# Patient Record
Sex: Female | Born: 1990 | Hispanic: No | Marital: Married | Smoking: Never smoker
Health system: Southern US, Community
[De-identification: ages and names within clinical notes are randomized; demographics above are authoritative.]

## PROBLEM LIST (undated history)

## (undated) ENCOUNTER — Inpatient Hospital Stay (HOSPITAL_COMMUNITY): Payer: Self-pay

## (undated) DIAGNOSIS — Z789 Other specified health status: Secondary | ICD-10-CM

## (undated) HISTORY — DX: Other specified health status: Z78.9

## (undated) HISTORY — PX: NOSE SURGERY: SHX723

---

## 2011-11-01 DIAGNOSIS — Z3480 Encounter for supervision of other normal pregnancy, unspecified trimester: Secondary | ICD-10-CM | POA: Insufficient documentation

## 2017-02-22 DIAGNOSIS — D649 Anemia, unspecified: Secondary | ICD-10-CM

## 2017-02-22 HISTORY — DX: Anemia, unspecified: D64.9

## 2017-02-22 NOTE — L&D Delivery Note (Signed)
Patient is a 27 y.o. now G3P3 s/p NSVD at 475w3d, who was admitted for SROM.  She progressed with augmentation to complete and pushed 4 minutes to deliver.  Cord clamping delayed by several minutes then clamped by CNM and cut by FOB.  Placenta intact and spontaneous, bleeding minimal.  1st degree laceration identified, not repaired- hemostatic .  Mom and baby stable prior to transfer to postpartum. She plans on breastfeeding. She is unsure of method for birth control.  Delivery Note At 10:04 PM a viable and healthy female was delivered via Vaginal, Spontaneous (Presentation: LOA ).  APGAR: 9, 9; weight pending.    Placenta intact and spontaneous, bleeding minimal. 3VCord:  with no complications   Anesthesia: None  Episiotomy: None Lacerations: 1st degree;Perineal Suture Repair: none- hemostatic  Est. Blood Loss (mL): 307  Mom to postpartum.  Baby to Couplet care / Skin to Skin.  Sharyon CableVeronica C Achsah Mcquade CNM  01/16/2018, 11:18 PM

## 2017-06-16 ENCOUNTER — Encounter (HOSPITAL_BASED_OUTPATIENT_CLINIC_OR_DEPARTMENT_OTHER): Payer: Self-pay | Admitting: *Deleted

## 2017-06-16 ENCOUNTER — Inpatient Hospital Stay (HOSPITAL_BASED_OUTPATIENT_CLINIC_OR_DEPARTMENT_OTHER)
Admission: EM | Admit: 2017-06-16 | Discharge: 2017-06-16 | Disposition: A | Discharge status: Other Acute Inpatient Hospital | Payer: Medicaid Other | Attending: Obstetrics and Gynecology | Admitting: Obstetrics and Gynecology

## 2017-06-16 ENCOUNTER — Inpatient Hospital Stay (HOSPITAL_COMMUNITY): Payer: Medicaid Other

## 2017-06-16 ENCOUNTER — Other Ambulatory Visit: Payer: Self-pay

## 2017-06-16 DIAGNOSIS — R102 Pelvic and perineal pain unspecified side: Secondary | ICD-10-CM | POA: Diagnosis present

## 2017-06-16 DIAGNOSIS — R109 Unspecified abdominal pain: Secondary | ICD-10-CM

## 2017-06-16 DIAGNOSIS — O209 Hemorrhage in early pregnancy, unspecified: Secondary | ICD-10-CM | POA: Diagnosis not present

## 2017-06-16 DIAGNOSIS — O26891 Other specified pregnancy related conditions, first trimester: Secondary | ICD-10-CM | POA: Diagnosis not present

## 2017-06-16 DIAGNOSIS — O26899 Other specified pregnancy related conditions, unspecified trimester: Secondary | ICD-10-CM | POA: Diagnosis present

## 2017-06-16 DIAGNOSIS — O26859 Spotting complicating pregnancy, unspecified trimester: Secondary | ICD-10-CM | POA: Diagnosis present

## 2017-06-16 DIAGNOSIS — Z3A01 Less than 8 weeks gestation of pregnancy: Secondary | ICD-10-CM | POA: Insufficient documentation

## 2017-06-16 LAB — CBC
HEMATOCRIT: 32.3 % — AB (ref 36.0–46.0)
Hemoglobin: 11 g/dL — ABNORMAL LOW (ref 12.0–15.0)
MCH: 28.6 pg (ref 26.0–34.0)
MCHC: 34.1 g/dL (ref 30.0–36.0)
MCV: 84.1 fL (ref 78.0–100.0)
PLATELETS: 234 10*3/uL (ref 150–400)
RBC: 3.84 MIL/uL — ABNORMAL LOW (ref 3.87–5.11)
RDW: 14.8 % (ref 11.5–15.5)
WBC: 7.8 10*3/uL (ref 4.0–10.5)

## 2017-06-16 LAB — URINALYSIS, ROUTINE W REFLEX MICROSCOPIC
Bilirubin Urine: NEGATIVE
Glucose, UA: NEGATIVE mg/dL
Hgb urine dipstick: NEGATIVE
KETONES UR: 15 mg/dL — AB
LEUKOCYTES UA: NEGATIVE
NITRITE: NEGATIVE
PH: 6 (ref 5.0–8.0)
Protein, ur: NEGATIVE mg/dL
Specific Gravity, Urine: 1.025 (ref 1.005–1.030)

## 2017-06-16 LAB — COMPREHENSIVE METABOLIC PANEL
ALBUMIN: 3.9 g/dL (ref 3.5–5.0)
ALT: 9 U/L — ABNORMAL LOW (ref 14–54)
AST: 15 U/L (ref 15–41)
Alkaline Phosphatase: 42 U/L (ref 38–126)
Anion gap: 7 (ref 5–15)
BILIRUBIN TOTAL: 0.4 mg/dL (ref 0.3–1.2)
BUN: 10 mg/dL (ref 6–20)
CHLORIDE: 105 mmol/L (ref 101–111)
CO2: 23 mmol/L (ref 22–32)
Calcium: 8.7 mg/dL — ABNORMAL LOW (ref 8.9–10.3)
Creatinine, Ser: 0.47 mg/dL (ref 0.44–1.00)
GFR calc Af Amer: >60 mL/min (ref >60)
GFR calc non Af Amer: >60 mL/min (ref >60)
GLUCOSE: 100 mg/dL — AB (ref 65–99)
POTASSIUM: 3.5 mmol/L (ref 3.5–5.1)
SODIUM: 135 mmol/L (ref 135–145)
Total Protein: 7.5 g/dL (ref 6.5–8.1)

## 2017-06-16 LAB — HCG, QUANTITATIVE, PREGNANCY: HCG, BETA CHAIN, QUANT, S: 139831 m[IU]/mL — AB (ref <5)

## 2017-06-16 LAB — PREGNANCY, URINE: PREG TEST UR: POSITIVE — AB

## 2017-06-16 MED ORDER — SODIUM CHLORIDE 0.9 % IV BOLUS
500.0000 mL | Freq: Once | INTRAVENOUS | Status: AC
  Administered 2017-06-16: 500 mL via INTRAVENOUS

## 2017-06-16 NOTE — ED Notes (Signed)
Patient also stated that she felt dizzy intermittently for 2 weeks.

## 2017-06-16 NOTE — MAU Note (Addendum)
Pt sent from [facility] for u/s.   Pt states she is pregnant and has had some spotting and cramping.

## 2017-06-16 NOTE — Discharge Instructions (Signed)
[place] [facility] Providers  ° ° °[facility]       Phone: [phone] ° °[facility] Phone: [phone] ° °[facility]  Phone: [phone] ° °[facility]  Phone: [phone] ° °[facility]  Phone: [phone] ° °[facility]       Phone: [phone] ° °[facility]    Phone: [phone]  ° °[facility]    Phone: [phone] ° °[facility]    Phone: [phone] ° °[facility]    Phone: [phone] ° °[facility]    Phone: [phone] ° °[facility]       Phone: [phone]  ° °[facility]  Phone: [phone] ° °[facility]    Phone: [phone] ° °[facility]   Phone: [phone] ° °[facility]      Phone: [phone] ° °[facility]    Phone: [phone] ° °Safe Medications in Pregnancy  ° °Acne: °Benzoyl Peroxide °Salicylic Acid ° °Backache/Headache: °Tylenol: 2 regular strength every 4 hours OR °             2 Extra strength every 6 hours ° °Colds/Coughs/Allergies: °Benadryl (alcohol free) 25 mg every 6 hours as needed °Breath right strips °Claritin °Cepacol throat lozenges °Chloraseptic throat spray °Cold-Eeze- up to three times per day °Cough drops, alcohol free °Flonase (by prescription only) °Guaifenesin °Mucinex °Robitussin DM (plain only, alcohol free) °Saline nasal spray/drops °Sudafed (pseudoephedrine) & Actifed ** use only after [redacted] weeks gestation and if you do not have high blood pressure °Tylenol °Vicks Vaporub °Zinc lozenges °Zyrtec  ° °Constipation: °Colace °Ducolax suppositories °Fleet enema °Glycerin suppositories °Metamucil °Milk of  magnesia °Miralax °Senokot °Smooth move tea ° °Diarrhea: °Kaopectate °Imodium A-D ° °*NO pepto Bismol ° °Hemorrhoids: °Anusol °Anusol HC °Preparation H °Tucks ° °Indigestion: °Tums °Maalox °Mylanta °Zantac  °Pepcid ° °Insomnia: °Benadryl (alcohol free) 25mg every 6 hours as needed °Tylenol PM °Unisom, no Gelcaps ° °Leg Cramps: °Tums °MagGel ° °Nausea/Vomiting:  °Bonine °Dramamine °Emetrol °Ginger extract °Sea bands °Meclizine  °Nausea medication to take during pregnancy:  °Unisom (doxylamine succinate 25 mg tablets) Take one tablet daily at bedtime. If symptoms are not adequately controlled, the dose can be increased to a maximum recommended dose of two tablets daily (1/2 tablet in the morning, 1/2 tablet mid-afternoon and one at bedtime). °Vitamin B6 100mg tablets. Take one tablet twice a day (up to 200 mg per day). ° °Skin Rashes: °Aveeno products °Benadryl cream or 25mg every 6 hours as needed °Calamine Lotion °1% cortisone cream ° °Yeast infection: °Gyne-lotrimin 7 °Monistat 7 ° ° °**If taking multiple medications, please check labels to avoid duplicating the same active ingredients °**take medication as directed on the label °** Do not exceed 4000 mg of tylenol in 24 hours °**Do not take medications that contain aspirin or ibuprofen ° ° ° ° °

## 2017-06-16 NOTE — ED Triage Notes (Signed)
Pt c/o lower abd pain , " spotting" x 2 days, ? 7 weeks preg

## 2017-06-16 NOTE — MAU Provider Note (Signed)
History     CSN: [id number]  Arrival date and time: 06/16/17 1413   First Provider Initiated Contact with Patient 06/16/17 1909      Chief Complaint  Patient presents with  . Abdominal Pain   HPI  Ms.  Bonnie Cross is a 27 y.o. year old G36P2002 female at [redacted]w[redacted]d weeks gestation who was sent to MAU by [facility] via [facility] for ectopic evaluation. She was seen earlier for spotting and abdominal cramping. Dr. Emelda Fear accepted the transfer of the patient. She recently moved here from [place] and does not have a local OB/GYN provider.  History reviewed. No pertinent past medical history.  Past Surgical History:  Procedure Laterality Date  . NOSE SURGERY      History reviewed. No pertinent family history.  Social History   Tobacco Use  . Smoking status: Never Smoker  . Smokeless tobacco: Never Used  Substance Use Topics  . Alcohol use: Never    Frequency: Never  . Drug use: Never    Allergies: No Known Allergies  No medications prior to admission.    Review of Systems  Constitutional: Negative.   HENT: Negative.   Eyes: Negative.   Respiratory: Negative.   Cardiovascular: Negative.   Gastrointestinal: Positive for abdominal distention ("I feel bloated at night").  Endocrine: Negative.   Genitourinary: Positive for pelvic pain (cramping like menstrual cramps; rated 2-3/10). Negative for vaginal bleeding ("had spotting with wiping earlier").  Musculoskeletal: Negative.   Skin: Negative.   Allergic/Immunologic: Negative.   Neurological: Negative.   Hematological: Negative.   Psychiatric/Behavioral: Negative.    Physical Exam   Blood pressure 96/90, pulse 88, temperature 98.4 F (36.9 C), temperature source Oral, resp. rate 16, height 5\' 3"  (1.6 m), weight 138 lb (62.6 kg), last menstrual period 04/22/2017, SpO2 100 %.  Physical Exam  Nursing note and vitals reviewed. Constitutional: She is oriented to person, place, and time. She appears  well-developed and well-nourished.  HENT:  Head: Normocephalic and atraumatic.  Eyes: Pupils are equal, round, and reactive to light.  Neck: Normal range of motion.  Respiratory: Effort normal.  GI: Soft.  Genitourinary:  Genitourinary Comments: Speculum Exam - no evidence of blood or active bleeding noted on exam, cervix closed/long/firm, no friability, no adnexal tenderness  Musculoskeletal: Normal range of motion.  Neurological: She is alert and oriented to person, place, and time.  Skin: Skin is warm and dry.  Psychiatric: She has a normal mood and affect. Her behavior is normal. Judgment and thought content normal.    MAU Course  Procedures  MDM OB < 14 wks U/S Offered Tylenol 1000 mg -- declined; pain rated 2-3 "it's more like discomfort"  US Ob Comp Less 14 Wks  Result Date: 06/16/2017 CLINICAL DATA:  Spotting and cramping EXAM: OBSTETRIC <14 WK [place] AND TRANSVAGINAL OB US TECHNIQUE: Both transabdominal and transvaginal ultrasound examinations were performed for complete evaluation of the gestation as well as the maternal uterus, adnexal regions, and pelvic cul-de-sac. Transvaginal technique was performed to assess early pregnancy. COMPARISON:  None. FINDINGS: Intrauterine gestational sac: Single intrauterine gestation Yolk sac:  Visible Embryo:  Visible Cardiac Activity: Visible Heart Rate: 167 bpm CRL:  10.3 mm   7 w   1 d                  [place] EDC: 02/01/2018 Subchorionic hemorrhage:  None visualized. Maternal uterus/adnexae: Ovaries are within normal limits. Left ovary measures 1.5 x 3.2 x 1.7 cm. Right ovary  measures 1.7 x 3.4 x 3 cm. IMPRESSION: Single viable intrauterine pregnancy as above. There are no specific abnormalities seen Electronically Signed   By: Jasmine PangKim  Fujinaga M.D.   On: 06/16/2017 20:24    Assessment and Plan  Spotting affecting pregnancy - Bleeding precautions given  Cramping affecting pregnancy, antepartum  - Ok to take Tylenol 100 mg every 6 hrs prn pain -  Safe meds in pregnancy list given  - Discharge patient - Establish prenatal care with provider of choice -- OB provider list given - Patient verbalized an understanding of the plan of care and agrees.    Bonnie Moraolitta Emmajean Ratledge, MSN, CNM 06/16/2017, 7:20 PM

## 2017-06-16 NOTE — ED Provider Notes (Signed)
[facility] EMERGENCY DEPARTMENT Provider Note   CSN: [id number] Arrival date & time: 06/16/17  1413     History   Chief Complaint Chief Complaint  Patient presents with  . Abdominal Pain    HPI Bonnie Cross is a 27 y.o. female G 3P2002 approximately [redacted] weeks pregnant by LMP 04/22/17, with no past medical history presenting with mild spotting and suprapubic dicomfort since yesterday.  She denies requiring use of pads but has noted some spotting when she uses the restroom.  Also reports suprapubic discomfort and cramping comparable to menstrual cramps. She has also been lightheaded for the last 2 weeks especially when she stands up.  Also reports lower back pain bilaterally. Normal bowel movements, no urinary symptoms.  She denies any fever, chills, vomiting. She has experienced spotting early in pregnancy in the past with one of her pregnancy, but it was earlier in pregnancy. She just moved from [place] and does not have an OB/GYN yet.  HPI  History reviewed. No pertinent past medical history.  There are no active problems to display for this patient.   Past Surgical History:  Procedure Laterality Date  . NOSE SURGERY       OB History    Gravida  1   Para      Term      Preterm      AB      Living        SAB      TAB      Ectopic      Multiple      Live Births               Home Medications    Prior to Admission medications   Not on File    Family History History reviewed. No pertinent family history.  Social History Social History   Tobacco Use  . Smoking status: Never Smoker  . Smokeless tobacco: Never Used  Substance Use Topics  . Alcohol use: Never    Frequency: Never  . Drug use: Never     Allergies   Patient has no known allergies.   Review of Systems Review of Systems  Constitutional: Negative for chills, diaphoresis and fever.  HENT: Negative for congestion.   Respiratory: Negative for cough, chest  tightness, shortness of breath, wheezing and stridor.   Cardiovascular: Negative for chest pain and leg swelling.  Gastrointestinal: Positive for abdominal pain. Negative for abdominal distention, blood in stool, diarrhea, nausea and vomiting.  Genitourinary: Positive for vaginal bleeding. Negative for difficulty urinating, dysuria, flank pain, hematuria, vaginal discharge and vaginal pain.  Musculoskeletal: Positive for back pain. Negative for gait problem, neck pain and neck stiffness.  Skin: Negative for color change, pallor and rash.  Neurological: Positive for light-headedness. Negative for dizziness, syncope, speech difficulty, weakness, numbness and headaches.  Psychiatric/Behavioral: Negative for behavioral problems.     Physical Exam Updated Vital Signs BP (!) 96/57 (BP Location: Left Arm)   Pulse 89   Temp 98.3 F (36.8 C) (Oral)   Resp 18   Ht 5\' 3"  (1.6 m)   Wt 62.6 kg (138 lb)   LMP 04/22/2017   SpO2 100%   BMI 24.45 kg/m   Physical Exam  Constitutional: She appears well-developed and well-nourished.  Non-toxic appearance. She does not appear ill. No distress.  HENT:  Head: Normocephalic and atraumatic.  Eyes: Conjunctivae are normal.  Neck: Neck supple.  Cardiovascular: Normal rate, regular rhythm and normal heart sounds.  No murmur heard. Pulmonary/Chest: Effort normal and breath sounds normal. No stridor. No respiratory distress. She has no wheezes. She has no rhonchi. She has no rales. She exhibits no tenderness.  Abdominal: Soft. Normal appearance and bowel sounds are normal. There is tenderness in the suprapubic area. There is no rigidity, no rebound, no guarding, no CVA tenderness, no tenderness at McBurney's point and negative Murphy's sign.  Mild discomfort with palpation of the suprapubic region.  Genitourinary:  Genitourinary Comments: Cervical os is closed and parous. Normal appearing cervical discharge, no bleeding  Musculoskeletal: She exhibits no  edema.  Neurological: She is alert.  Skin: Skin is warm and dry. No rash noted. She is not diaphoretic. No erythema. No pallor.  Psychiatric: She has a normal mood and affect.  Nursing note and vitals reviewed.    ED Treatments / Results  Labs (all labs ordered are listed, but only abnormal results are displayed) Labs Reviewed  URINALYSIS, ROUTINE W REFLEX MICROSCOPIC - Abnormal; Notable for the following components:      Result Value   Ketones, ur 15 (*)    All other components within normal limits  PREGNANCY, URINE - Abnormal; Notable for the following components:   Preg Test, Ur POSITIVE (*)    All other components within normal limits  HCG, QUANTITATIVE, PREGNANCY - Abnormal; Notable for the following components:   hCG, Beta Chain, Quant, S [id number] (*)    All other components within normal limits  CBC - Abnormal; Notable for the following components:   RBC 3.84 (*)    Hemoglobin 11.0 (*)    HCT 32.3 (*)    All other components within normal limits  COMPREHENSIVE METABOLIC PANEL - Abnormal; Notable for the following components:   Glucose, Bld 100 (*)    Calcium 8.7 (*)    ALT 9 (*)    All other components within normal limits  ABO/RH    EKG None  Radiology No results found.  Procedures Procedures (including critical care time)  Medications Ordered in ED Medications  sodium chloride 0.9 % bolus 500 mL (0 mLs Intravenous Stopped 06/16/17 1553)     Initial Impression / Assessment and Plan / ED Course  I have reviewed the triage vital signs and the nursing notes.  Pertinent labs & imaging results that were available during my care of the patient were reviewed by me and considered in my medical decision making (see chart for details).    G3 P2-0-0-2 presenting with 2 days of mild vaginal spotting and suprapubic discomfort with cramping described as menstrual type cramps.  Ultrasound not available at this location, will need to be transferred for imaging to rule  out ectopic Labs ordered Patient is otherwise well-appearing, nontoxic and hemodynamically stable. Reassuring exam, pelvic exam without evidence of bleeding, os is closed.  Hemoglobin of 11 hCG quant within normal range for 7-week pregnancy 139,831.  Patient was discussed with Dr. Christin BachJohn Ferguson who accepted patient for transfer to women's MAU for US to rule out ectopic.  Patient will be transferred via [facility]. She is well-appearing and hemodynamically stable prior to transfer. Personally repeated BP 103/74.  Final Clinical Impressions(s) / ED Diagnoses   Final diagnoses:  Vaginal bleeding in pregnancy, first trimester  Abdominal pain during pregnancy in first trimester    ED Discharge Orders    None       Gregary CromerMitchell, Lindell Renfrew B, PA-C 06/16/17 1714    Vanetta MuldersZackowski, Scott, MD 06/17/17 [phone]

## 2017-06-16 NOTE — ED Notes (Signed)
ED Provider at bedside. 

## 2017-08-10 ENCOUNTER — Ambulatory Visit (INDEPENDENT_AMBULATORY_CARE_PROVIDER_SITE_OTHER): Payer: Medicaid Other | Admitting: Obstetrics and Gynecology

## 2017-08-10 ENCOUNTER — Encounter: Payer: Self-pay | Admitting: Obstetrics and Gynecology

## 2017-08-10 ENCOUNTER — Other Ambulatory Visit (HOSPITAL_COMMUNITY)
Admission: RE | Admit: 2017-08-10 | Discharge: 2017-08-10 | Disposition: A | Discharge status: Home / Self Care | Payer: Medicaid Other | Source: Ambulatory Visit | Attending: Obstetrics and Gynecology | Admitting: Obstetrics and Gynecology

## 2017-08-10 VITALS — BP 112/71 | HR 98 | Wt 144.0 lb

## 2017-08-10 DIAGNOSIS — Z3492 Encounter for supervision of normal pregnancy, unspecified, second trimester: Secondary | ICD-10-CM

## 2017-08-10 DIAGNOSIS — Z3480 Encounter for supervision of other normal pregnancy, unspecified trimester: Secondary | ICD-10-CM

## 2017-08-10 DIAGNOSIS — Z3490 Encounter for supervision of normal pregnancy, unspecified, unspecified trimester: Secondary | ICD-10-CM | POA: Diagnosis not present

## 2017-08-10 MED ORDER — FOLIC ACID 1 MG PO TABS: 1.0000 mg | ORAL_TABLET | Freq: Every day | ORAL | 3 refills | Status: DC

## 2017-08-10 MED FILL — FOLIC ACID 1 MG TABS: 1 | 90 days supply | Qty: 90 | Fill #0

## 2017-08-10 NOTE — Progress Notes (Signed)
INITIAL PRENATAL VISIT NOTE  Subjective:  Bonnie Cross is a 27 y.o. G3P2002 at 6077w5d by LMP c/w 7 weeks US being seen today for her initial prenatal visit.  She and partner are happy with the pregnancy. She has an obstetric history significant for 2 x SVD. She has a medical history significant for n/a.  Patient reports no complaints. Vag. Bleeding: None. Denies leaking of fluid.    Past Medical History:  Diagnosis Date  . Medical history non-contributory     Past Surgical History:  Procedure Laterality Date  . NOSE SURGERY      OB History  Gravida Para Term Preterm AB Living  3 2 2     2   SAB TAB Ectopic Multiple Live Births          2    # Outcome Date GA Lbr Len/2nd Weight Sex Delivery Anes PTL Lv  3 Current           2 Term 2014    F Vag-Spont   LIV  1 Term 2012    F Vag-Spont   LIV    Social History   Socioeconomic History  . Marital status: Single    Spouse name: Not on file  . Number of children: Not on file  . Years of education: Not on file  . Highest education level: Not on file  Occupational History  . Not on file  Social Needs  . Financial resource strain: Not on file  . Food insecurity:    Worry: Not on file    Inability: Not on file  . Transportation needs:    Medical: Not on file    Non-medical: Not on file  Tobacco Use  . Smoking status: Never Smoker  . Smokeless tobacco: Never Used  Substance and Sexual Activity  . Alcohol use: Never    Frequency: Never  . Drug use: Never  . Sexual activity: Yes    Birth control/protection: None  Lifestyle  . Physical activity:    Days per week: Not on file    Minutes per session: Not on file  . Stress: Not on file  Relationships  . Social connections:    Talks on phone: Not on file    Gets together: Not on file    Attends religious service: Not on file    Active member of club or organization: Not on file    Attends meetings of clubs or organizations: Not on file    Relationship status: Not  on file  Other Topics Concern  . Not on file  Social History Narrative  . Not on file    Family History  Problem Relation Age of Onset  . Cancer Paternal Uncle        blood CA  . Diabetes Neg Hx   . Hypertension Neg Hx      (Not in a hospital admission)  No Known Allergies  Review of Systems: Negative except for what is mentioned in HPI.  Objective:   Vitals:   08/10/17 1512  BP: 112/71  Pulse: 98  Weight: 144 lb (65.3 kg)    Fetal Status:           Physical Exam: BP 112/71   Pulse 98   Wt 144 lb (65.3 kg)   LMP 04/22/2017 (Exact Date)   BMI 25.51 kg/m  CONSTITUTIONAL: Well-developed, well-nourished female in no acute distress.  NEUROLOGIC: Alert and oriented to person, place, and time. Normal reflexes, muscle tone coordination. No cranial  nerve deficit noted. PSYCHIATRIC: Normal mood and affect. Normal behavior. Normal judgment and thought content. SKIN: Skin is warm and dry. No rash noted. Not diaphoretic. No erythema. No pallor. HENT:  Normocephalic, atraumatic, External right and left ear normal. Oropharynx is clear and moist EYES: Conjunctivae and EOM are normal. Pupils are equal, round, and reactive to light. No scleral icterus.  NECK: Normal range of motion, supple, no masses CARDIOVASCULAR: Normal heart rate noted, regular rhythm RESPIRATORY: Effort and breath sounds normal, no problems with respiration noted BREASTS: symmetric, non-tender, no masses palpable ABDOMEN: Soft, nontender, nondistended, gravid. GU: normal appearing external female genitalia, small inclusion cyst at introitus, multiparous normal appearing cervix, scant white discharge in vagina, no lesions noted Bimanual: 14 weeks sized uterus, no adnexal tenderness or palpable lesions noted MUSCULOSKELETAL: Normal range of motion. EXT:  No edema and no tenderness. 2+ distal pulses.  Assessment and Plan:  Pregnancy: G3P2002 at [redacted]w[redacted]d by LMP c/w 7 week [place].  1. Prenatal care, antepartum - [place]  MFM OB DETAIL +14 WK; Future - Obstetric Panel, Including HIV - Urine Culture - Cytology - PAP - Cystic Fibrosis Mutation 97  2. Supervision of other normal pregnancy, antepartum Patient cannot take prenatal vitamins as they make her sick, cannot take gummy vitamins due to religious reasons (cannot have gelatin). Reviewed diet and she describes a healthy diet. Will start folic acid supplementation, she is agreeable to this.    The nature of this practice with multiple providers, female and female, was reviewed with patient and she verbalizes understanding.  Preterm labor symptoms and general obstetric precautions including but not limited to vaginal bleeding, contractions, leaking of fluid and fetal movement were reviewed in detail with the patient.  Please refer to After Visit Summary for other counseling recommendations.   Return in about 1 month (around 09/07/2017) for OB visit.  Conan Bowens 08/10/2017 3:57 PM

## 2017-08-12 ENCOUNTER — Telehealth: Payer: Self-pay

## 2017-08-12 LAB — URINE CULTURE

## 2017-08-12 MED ORDER — CEPHALEXIN 500 MG PO CAPS: 500.0000 mg | ORAL_CAPSULE | Freq: Four times a day (QID) | ORAL | 0 refills | Status: DC

## 2017-08-12 MED FILL — CEPHALEXIN 500 MG CAPSULE: 500 | 7 days supply | Qty: 28 | Fill #0

## 2017-08-12 NOTE — Addendum Note (Signed)
Addended by: Leroy LibmanAVIS, Nelline Lio on: 08/12/2017 09:10 AM   Modules accepted: Orders

## 2017-08-12 NOTE — Telephone Encounter (Signed)
Per Dr. Earlene Plateravis pt has a UTI and Keflex was sent to her pharmacy. Called pt two times got vm. Left message for pt to call our office back.

## 2017-08-15 LAB — CYTOLOGY - PAP
Chlamydia: NEGATIVE
DIAGNOSIS: NEGATIVE
Neisseria Gonorrhea: NEGATIVE

## 2017-08-15 NOTE — Telephone Encounter (Signed)
Called pt with results. Understanding was voiced.  

## 2017-08-15 NOTE — Telephone Encounter (Signed)
-----   Message from Conan BowensKelly M Davis, MD sent at 08/12/2017  9:10 AM EDT ----- Please call patient and inform her of UTI, I sent keflex to pharmacy.

## 2017-08-16 ENCOUNTER — Encounter: Payer: Self-pay | Admitting: Family Medicine

## 2017-08-17 LAB — OBSTETRIC PANEL, INCLUDING HIV
ANTIBODY SCREEN: NEGATIVE
BASOS: 0 %
Basophils Absolute: 0 10*3/uL (ref 0.0–0.2)
EOS (ABSOLUTE): 0 10*3/uL (ref 0.0–0.4)
EOS: 0 %
HEMATOCRIT: 32.8 % — AB (ref 34.0–46.6)
HIV Screen 4th Generation wRfx: NONREACTIVE
Hemoglobin: 10.6 g/dL — ABNORMAL LOW (ref 11.1–15.9)
Hepatitis B Surface Ag: NEGATIVE
Immature Grans (Abs): 0 10*3/uL (ref 0.0–0.1)
Immature Granulocytes: 0 %
LYMPHS ABS: 1.9 10*3/uL (ref 0.7–3.1)
Lymphs: 24 %
MCH: 27.3 pg (ref 26.6–33.0)
MCHC: 32.3 g/dL (ref 31.5–35.7)
MCV: 85 fL (ref 79–97)
MONOS ABS: 0.5 10*3/uL (ref 0.1–0.9)
Monocytes: 7 %
Neutrophils Absolute: 5.4 10*3/uL (ref 1.4–7.0)
Neutrophils: 69 %
Platelets: 248 10*3/uL (ref 150–450)
RBC: 3.88 x10E6/uL (ref 3.77–5.28)
RDW: 15.7 % — AB (ref 12.3–15.4)
RH TYPE: POSITIVE
RPR Ser Ql: NONREACTIVE
Rubella Antibodies, IGG: 18.3 index (ref >0.99)
WBC: 7.9 10*3/uL (ref 3.4–10.8)

## 2017-08-17 LAB — CYSTIC FIBROSIS MUTATION 97: GENE DIS ANAL CARRIER INTERP BLD/T-IMP: NOT DETECTED

## 2017-08-26 ENCOUNTER — Encounter (HOSPITAL_COMMUNITY): Payer: Self-pay

## 2017-09-01 NOTE — Addendum Note (Signed)
Addended by: Anell BarrHOWARD, JENNIFER L on: 09/01/2017 10:07 AM   Modules accepted: Orders

## 2017-09-02 ENCOUNTER — Ambulatory Visit (HOSPITAL_COMMUNITY)
Admission: RE | Admit: 2017-09-02 | Discharge: 2017-09-02 | Disposition: A | Discharge status: Home / Self Care | Payer: Medicaid Other | Source: Ambulatory Visit | Attending: Obstetrics and Gynecology | Admitting: Obstetrics and Gynecology

## 2017-09-02 DIAGNOSIS — Z363 Encounter for antenatal screening for malformations: Secondary | ICD-10-CM | POA: Insufficient documentation

## 2017-09-02 DIAGNOSIS — Z3490 Encounter for supervision of normal pregnancy, unspecified, unspecified trimester: Secondary | ICD-10-CM

## 2017-09-02 DIAGNOSIS — Z3480 Encounter for supervision of other normal pregnancy, unspecified trimester: Secondary | ICD-10-CM

## 2017-09-13 ENCOUNTER — Ambulatory Visit (INDEPENDENT_AMBULATORY_CARE_PROVIDER_SITE_OTHER): Payer: Medicaid Other | Admitting: Advanced Practice Midwife

## 2017-09-13 ENCOUNTER — Encounter: Payer: Self-pay | Admitting: Advanced Practice Midwife

## 2017-09-13 VITALS — BP 111/72 | HR 99 | Wt 152.0 lb

## 2017-09-13 DIAGNOSIS — Z3490 Encounter for supervision of normal pregnancy, unspecified, unspecified trimester: Secondary | ICD-10-CM

## 2017-09-13 DIAGNOSIS — Z3482 Encounter for supervision of other normal pregnancy, second trimester: Secondary | ICD-10-CM

## 2017-09-13 NOTE — Progress Notes (Signed)
   PRENATAL VISIT NOTE  Subjective:  Bonnie Cross is a 27 y.o. G3P2002 at 2662w4d being seen today for ongoing prenatal care.  She is currently monitored for the following issues for this low-risk pregnancy and has Spotting affecting pregnancy; Cramping affecting pregnancy, antepartum; and Encounter for supervision of other normal pregnancy on their problem list.  Patient reports no complaints.  Contractions: Not present. Vag. Bleeding: None.  Movement: Present. Denies leaking of fluid.   The following portions of the patient's history were reviewed and updated as appropriate: allergies, current medications, past family history, past medical history, past social history, past surgical history and problem list. Problem list updated.  Objective:   Vitals:   09/13/17 1035  BP: 111/72  Pulse: 99  Weight: 152 lb (68.9 kg)    Fetal Status: Fetal Heart Rate (bpm): 145   Movement: Present     General:  Alert, oriented and cooperative. Patient is in no acute distress.  Skin: Skin is warm and dry. No rash noted.   Cardiovascular: Normal heart rate noted  Respiratory: Normal respiratory effort, no problems with respiration noted  Abdomen: Soft, gravid, appropriate for gestational age.  Pain/Pressure: Absent     Pelvic: Cervical exam deferred        Extremities: Normal range of motion.  Edema: None  Mental Status: Normal mood and affect. Normal behavior. Normal judgment and thought content.   Assessment and Plan:  Pregnancy: G3P2002 at 2662w4d  1. Prenatal care, antepartum     Reviewed US results which were normal     Reviewed location of hospital and how to use MAU     Reviewed normal fetal development   Preterm labor symptoms and general obstetric precautions including but not limited to vaginal bleeding, contractions, leaking of fluid and fetal movement were reviewed in detail with the patient. Please refer to After Visit Summary for other counseling recommendations.  [facility] about 1  [facility] (around [facility]11[facility]2017[facility]  Future Appointments  Date Time Provider Department Center  10/12/2017  1:30 PM Willodean RosenthalHarraway-Smith, Carolyn, MD [facility] None    Wynelle BourgeoisMarie Meilah Delrosario, CNM

## 2017-09-13 NOTE — Patient Instructions (Signed)
Second Trimester of Pregnancy The second trimester is from week 13 through week 28, month 4 through 6. This is often the time in pregnancy that you feel your best. Often times, morning sickness has lessened or quit. You may have more energy, and you may get hungry more often. Your unborn baby (fetus) is growing rapidly. At the end of the sixth month, he or she is about 9 inches long and weighs about 1 pounds. You will likely feel the baby move (quickening) between 18 and 20 weeks of pregnancy. Follow these instructions at home:  Avoid all smoking, herbs, and alcohol. Avoid drugs not approved by your doctor.  Do not use any tobacco products, including cigarettes, chewing tobacco, and electronic cigarettes. If you need help quitting, ask your doctor. You may get counseling or other support to help you quit.  Only take medicine as told by your doctor. Some medicines are safe and some are not during pregnancy.  Exercise only as told by your doctor. Stop exercising if you start having cramps.  Eat regular, healthy meals.  Wear a good support bra if your breasts are tender.  Do not use hot tubs, steam rooms, or saunas.  Wear your seat belt when driving.  Avoid raw meat, uncooked cheese, and liter boxes and soil used by cats.  Take your prenatal vitamins.  Take 1500-2000 milligrams of calcium daily starting at the 20th week of pregnancy until you deliver your baby.  Try taking medicine that helps you poop (stool softener) as needed, and if your doctor approves. Eat more fiber by eating fresh fruit, vegetables, and whole grains. Drink enough fluids to keep your pee (urine) clear or pale yellow.  Take warm water baths (sitz baths) to soothe pain or discomfort caused by hemorrhoids. Use hemorrhoid cream if your doctor approves.  If you have puffy, bulging veins (varicose veins), wear support hose. Raise (elevate) your feet for 15 minutes, 3-4 times a day. Limit salt in your diet.  Avoid heavy  lifting, wear low heals, and sit up straight.  Rest with your legs raised if you have leg cramps or low back pain.  Visit your dentist if you have not gone during your pregnancy. Use a soft toothbrush to brush your teeth. Be gentle when you floss.  You can have sex (intercourse) unless your doctor tells you not to.  Go to your doctor visits. Get help if:  You feel dizzy.  You have mild cramps or pressure in your lower belly (abdomen).  You have a nagging pain in your belly area.  You continue to feel sick to your stomach (nauseous), throw up (vomit), or have watery poop (diarrhea).  You have bad smelling fluid coming from your vagina.  You have pain with peeing (urination). Get help right away if:  You have a fever.  You are leaking fluid from your vagina.  You have spotting or bleeding from your vagina.  You have severe belly cramping or pain.  You lose or gain weight rapidly.  You have trouble catching your breath and have chest pain.  You notice sudden or extreme puffiness (swelling) of your face, hands, ankles, feet, or legs.  You have not felt the baby move in over an hour.  You have severe headaches that do not go away with medicine.  You have vision changes. This information is not intended to replace advice given to you by your health care provider. Make sure you discuss any questions you have with your health care   provider. Document Released: 05/05/2009 Document Revised: 07/17/2015 Document Reviewed: 04/11/2012 Elsevier Interactive Patient Education  2017 [employer]vier Inc[employer]  

## 2017-09-13 NOTE — Progress Notes (Deleted)
   PRENATAL VISIT NOTE  Subjective:  Bonnie Cross is a 27 y.o. G3P2002 at 8323w4d being seen today for ongoing prenatal care.  She is currently monitored for the following issues for this {Blank single:19197::"high-risk","low-risk"} pregnancy and has Spotting affecting pregnancy; Cramping affecting pregnancy, antepartum; and Encounter for supervision of other normal pregnancy on their problem list.  Patient reports {sx:14538}.  Contractions: Not present. Vag. Bleeding: None.  Movement: Present. Denies leaking of fluid.   The following portions of the patient's history were reviewed and updated as appropriate: allergies, current medications, past family history, past medical history, past social history, past surgical history and problem list. Problem list updated.  Objective:   Vitals:   09/13/17 1035  BP: 111/72  Pulse: 99  Weight: 152 lb (68.9 kg)    Fetal Status: Fetal Heart Rate (bpm): 145   Movement: Present     General:  Alert, oriented and cooperative. Patient is in no acute distress.  Skin: Skin is warm and dry. No rash noted.   Cardiovascular: Normal heart rate noted  Respiratory: Normal respiratory effort, no problems with respiration noted  Abdomen: Soft, gravid, appropriate for gestational age.  Pain/Pressure: Absent     Pelvic: {Blank single:19197::"Cervical exam performed","Cervical exam deferred"}        Extremities: Normal range of motion.  Edema: None  Mental Status: Normal mood and affect. Normal behavior. Normal judgment and thought content.   Assessment and Plan:  Pregnancy: G3P2002 at 7623w4d  1. Prenatal care, antepartum ***  {Blank single:19197::"Term","Preterm"} labor symptoms and general obstetric precautions including but not limited to vaginal bleeding, contractions, leaking of fluid and fetal movement were reviewed in detail with the patient. Please refer to After Visit Summary for other counseling recommendations.  [facility] (around  10[facility]11/2017[facility]  Future Appointments  Date Time Provider Department Center  10/12/2017  1:30 PM Willodean RosenthalHarraway-Smith, Carolyn, MD [facility] None    Wynelle BourgeoisMarie Sakeena Teall, CNM

## 2017-10-12 ENCOUNTER — Ambulatory Visit (INDEPENDENT_AMBULATORY_CARE_PROVIDER_SITE_OTHER): Payer: Medicaid Other | Admitting: Obstetrics & Gynecology

## 2017-10-12 VITALS — BP 101/66 | HR 118 | Wt 158.1 lb

## 2017-10-12 DIAGNOSIS — Z3480 Encounter for supervision of other normal pregnancy, unspecified trimester: Secondary | ICD-10-CM

## 2017-10-12 DIAGNOSIS — O26842 Uterine size-date discrepancy, second trimester: Secondary | ICD-10-CM

## 2017-10-12 DIAGNOSIS — O9229 Other disorders of breast associated with pregnancy and the puerperium: Secondary | ICD-10-CM

## 2017-10-12 DIAGNOSIS — O26892 Other specified pregnancy related conditions, second trimester: Secondary | ICD-10-CM

## 2017-10-12 DIAGNOSIS — O26899 Other specified pregnancy related conditions, unspecified trimester: Secondary | ICD-10-CM

## 2017-10-12 DIAGNOSIS — O26849 Uterine size-date discrepancy, unspecified trimester: Secondary | ICD-10-CM | POA: Insufficient documentation

## 2017-10-12 DIAGNOSIS — R109 Unspecified abdominal pain: Secondary | ICD-10-CM

## 2017-10-12 DIAGNOSIS — Z3482 Encounter for supervision of other normal pregnancy, second trimester: Secondary | ICD-10-CM

## 2017-10-12 NOTE — Progress Notes (Signed)
   PRENATAL VISIT NOTE  Subjective:  Bonnie Cross is a 27 y.o. G3P2002 at 2373w5d being seen today for ongoing prenatal care.  She is currently monitored for the following issues for this low-risk pregnancy and has Spotting affecting pregnancy; Cramping affecting pregnancy, antepartum; Supervision of other normal pregnancy, antepartum; Uterine size date discrepancy, antepartum; and Breast symptom in antepartum period on their problem list.  Patient reports slight dizziness when showering only. A nodule in her right breast. NO nipple discharge.  .  Contractions: Not present. Vag. Bleeding: None.  Movement: Present. Denies leaking of fluid.   The following portions of the patient's history were reviewed and updated as appropriate: allergies, current medications, past family history, past medical history, past social history, past surgical history and problem list. Problem list updated.  Objective:   Vitals:   10/12/17 1327  BP: 101/66  Pulse: (!) 118  Weight: 158 lb 1.9 oz (71.7 kg)    Fetal Status: Fetal Heart Rate (bpm): 153   Movement: Present     General:  Alert, oriented and cooperative. Patient is in no acute distress.  Skin: Skin is warm and dry. No rash noted.   Cardiovascular: Normal heart rate noted  Respiratory: Normal respiratory effort, no problems with respiration noted  Abdomen: Soft, gravid, appropriate for gestational age.  Pain/Pressure: Present     Pelvic: Cervical exam deferred        Extremities: Normal range of motion.  Edema: Trace  Mental Status: Normal mood and affect. Normal behavior. Normal judgment and thought content.   Assessment and Plan:  Pregnancy: G3P2002 at 6673w5d  1. Supervision of other normal pregnancy, antepartum F/u in 1 week for labs and 2 hour GTT  2. Cramping affecting pregnancy, antepartum Resolved.  Some pressure on sides bilaterally- rec maternity belt.  3. Size/dates discrepancy S>D. If contd on next visit, rec f/u US to eval  4.  Breast sx ante Right breast nodularity.  No distinct mass palpated. Rec f/u a tnext vist. If still present, consider breast US    Preterm labor symptoms and general obstetric precautions including but not limited to vaginal bleeding, contractions, leaking of fluid and fetal movement were reviewed in detail with the patient. Please refer to After Visit Summary for other counseling recommendations.  Return in about 4 weeks (around 11/09/2017).  No future appointments.  Willodean Rosenthalarolyn Harraway-Smith, MD

## 2017-10-24 ENCOUNTER — Encounter (HOSPITAL_BASED_OUTPATIENT_CLINIC_OR_DEPARTMENT_OTHER): Payer: Self-pay

## 2017-10-24 ENCOUNTER — Other Ambulatory Visit: Payer: Self-pay

## 2017-10-24 ENCOUNTER — Emergency Department (HOSPITAL_BASED_OUTPATIENT_CLINIC_OR_DEPARTMENT_OTHER)
Admission: EM | Admit: 2017-10-24 | Discharge: 2017-10-24 | Disposition: A | Discharge status: Home / Self Care | Payer: Medicaid Other | Attending: Emergency Medicine | Admitting: Emergency Medicine

## 2017-10-24 DIAGNOSIS — R05 Cough: Secondary | ICD-10-CM | POA: Diagnosis present

## 2017-10-24 DIAGNOSIS — B9789 Other viral agents as the cause of diseases classified elsewhere: Secondary | ICD-10-CM | POA: Diagnosis not present

## 2017-10-24 DIAGNOSIS — J069 Acute upper respiratory infection, unspecified: Secondary | ICD-10-CM

## 2017-10-24 DIAGNOSIS — Z79899 Other long term (current) drug therapy: Secondary | ICD-10-CM | POA: Insufficient documentation

## 2017-10-24 MED ORDER — ALBUTEROL SULFATE HFA 108 (90 BASE) MCG/ACT IN AERS
2.0000 | INHALATION_SPRAY | Freq: Once | RESPIRATORY_TRACT | Status: AC
  Administered 2017-10-24: 2 via RESPIRATORY_TRACT
  Filled 2017-10-24: qty 6.7

## 2017-10-24 NOTE — ED Notes (Signed)
ED Provider at bedside. 

## 2017-10-24 NOTE — Discharge Instructions (Signed)
Take tylenol 1000mg(2 extra strength) four times a day.  ° °

## 2017-10-24 NOTE — ED Triage Notes (Signed)
C/o cough x 5 days-NAD-steady gait

## 2017-10-24 NOTE — ED Provider Notes (Signed)
[facility] EMERGENCY DEPARTMENT Provider Note   CSN: [id number] Arrival date & time: 10/24/17  1059     History   Chief Complaint Chief Complaint  Patient presents with  . Cough    HPI Bonnie Cross is a 27 y.o. female.  27 yo F with a chief complaint of cough and congestion.  This been going on for the past for 5 days.  She has a daughter that has a similar illness.  She took the daughter to the pediatrician and they have started on antibiotics.  She is not sure what specific illness that she is on antibiotics for.  She denies ear pain denies facial pain denies fevers.  Coughing is worse at night and she is beginning to feel that maybe she has trouble breathing when she lays back flat.  Denies lower extremity edema.  She has not tried anything yet for this.  The history is provided by the patient.  Cough  This is a new problem. The current episode started more than 2 days ago. The problem occurs constantly. The problem has been gradually worsening. The cough is non-productive. There has been no fever. Associated symptoms include shortness of breath. Pertinent negatives include no chest pain, no chills, no headaches, no rhinorrhea, no myalgias, no wheezing and no eye redness. She has tried nothing for the symptoms. The treatment provided no relief. She is not a smoker. Her past medical history does not include asthma.    Past Medical History:  Diagnosis Date  . Medical history non-contributory     Patient Active Problem List   Diagnosis Date Noted  . Uterine size date discrepancy, antepartum 10/12/2017  . Breast symptom in antepartum period 10/12/2017  . Spotting affecting pregnancy 06/16/2017  . Cramping affecting pregnancy, antepartum 06/16/2017  . Supervision of other normal pregnancy, antepartum 11/01/2011    Past Surgical History:  Procedure Laterality Date  . NOSE SURGERY       OB History    Gravida  3   Para  2   Term  2   Preterm      AB        Living  2     SAB      TAB      Ectopic      Multiple      Live Births  2            Home Medications    Prior to Admission medications   Medication Sig Start Date End Date Taking? Authorizing Provider  acetaminophen (TYLENOL) 325 MG tablet Take 325 mg by mouth every 6 (six) hours as needed for headache.    [provider]  butalbital-acetaminophen-caffeine (FIORICET WITH CODEINE) 50-325-40-30 MG capsule Take by mouth.    [provider]  Cholecalciferol (VITAMIN D3) 50000 units CAPS Take by mouth.    [provider]  folic acid (FOLVITE) 1 MG tablet Take 1 tablet (1 mg total) by mouth daily. 08/10/17   Conan Bowens, MD    Family History Family History  Problem Relation Age of Onset  . Cancer Paternal Uncle        blood CA  . Diabetes Neg Hx   . Hypertension Neg Hx     Social History Social History   Tobacco Use  . Smoking status: Never Smoker  . Smokeless tobacco: Never Used  Substance Use Topics  . Alcohol use: Never    Frequency: Never  . Drug use: Never  Allergies   Patient has no known allergies.   Review of Systems Review of Systems  Constitutional: Negative for chills and fever.  HENT: Positive for congestion. Negative for rhinorrhea.   Eyes: Negative for redness and visual disturbance.  Respiratory: Positive for cough and shortness of breath. Negative for wheezing.   Cardiovascular: Negative for chest pain and palpitations.  Gastrointestinal: Negative for nausea and vomiting.  Genitourinary: Negative for dysuria and urgency.  Musculoskeletal: Negative for arthralgias and myalgias.  Skin: Negative for pallor and wound.  Neurological: Negative for dizziness and headaches.     Physical Exam Updated Vital Signs BP 109/82 (BP Location: Left Arm)   Pulse (!) 105   Temp 98.4 F (36.9 C) (Oral)   Resp 20   Ht 5\' 3"  (1.6 m)   Wt 73.5 kg   LMP 04/22/2017 (Exact Date)   SpO2 100%   BMI 28.70 kg/m    Physical Exam  Constitutional: She is oriented to person, place, and time. She appears well-developed and well-nourished. No distress.  HENT:  Head: Normocephalic and atraumatic.  Swollen turbinates, posterior nasal drip, no noted sinus ttp, tm with small serous effusions bilaterally no erythema or bulging.   Eyes: Pupils are equal, round, and reactive to light. EOM are normal.  Neck: Normal range of motion. Neck supple.  Cardiovascular: Normal rate and regular rhythm. Exam reveals no gallop and no friction rub.  No murmur heard. Pulmonary/Chest: Effort normal. She has no wheezes. She has no rales.  Abdominal: Soft. She exhibits no distension. There is no tenderness.  Musculoskeletal: She exhibits no edema or tenderness.  Neurological: She is alert and oriented to person, place, and time.  Skin: Skin is warm and dry. She is not diaphoretic.  Psychiatric: She has a normal mood and affect. Her behavior is normal.  Nursing note and vitals reviewed.    ED Treatments / Results  Labs (all labs ordered are listed, but only abnormal results are displayed) Labs Reviewed - No data to display  EKG None  Radiology No results found.  Procedures Procedures (including critical care time)  Medications Ordered in ED Medications  albuterol (PROVENTIL HFA;VENTOLIN HFA) 108 (90 Base) MCG/ACT inhaler 2 puff (has no administration in time range)     Initial Impression / Assessment and Plan / ED Course  I have reviewed the triage vital signs and the nursing notes.  Pertinent labs & imaging results that were available during my care of the patient were reviewed by me and considered in my medical decision making (see chart for details).     27 yo F with a chief complaint of cough and congestion.  Most likely this is a viral URI.  She has no bacterial source is found on my exam.  I discussed limited utility of antibiotics and also cautioned her to take any medications while she is pregnant.   She is requesting an albuterol inhaler.  She had an upper restaurant infection last year that she thought this mildly improved her symptoms.  Again discussed the possible risks.  We will have her follow-up with her OB/GYN.  11:26 AM:  I have discussed the diagnosis/risks/treatment options with the patient and believe the pt to be eligible for discharge home to follow-up with PCP. We also discussed returning to the ED immediately if new or worsening sx occur. We discussed the sx which are most concerning (e.g., sudden worsening pain, fever, inability to tolerate by mouth) that necessitate immediate return. Medications administered to the patient during  their visit and any new prescriptions provided to the patient are listed below.  Medications given during this visit Medications  albuterol (PROVENTIL HFA;VENTOLIN HFA) 108 (90 Base) MCG/ACT inhaler 2 puff (has no administration in time range)      The patient appears reasonably screen and/or stabilized for discharge and I doubt any other medical condition or other [facility] requiring further screening, evaluation, or treatment in the ED at this time prior to discharge.    Final Clinical Impressions(s) / ED Diagnoses   Final diagnoses:  Viral URI with cough    ED Discharge Orders    None       Melene Plan, DO 10/24/17 1126

## 2017-11-03 ENCOUNTER — Emergency Department (HOSPITAL_BASED_OUTPATIENT_CLINIC_OR_DEPARTMENT_OTHER): Payer: Medicaid Other

## 2017-11-03 ENCOUNTER — Other Ambulatory Visit: Payer: Self-pay

## 2017-11-03 ENCOUNTER — Emergency Department (HOSPITAL_BASED_OUTPATIENT_CLINIC_OR_DEPARTMENT_OTHER)
Admission: EM | Admit: 2017-11-03 | Discharge: 2017-11-03 | Disposition: A | Discharge status: Home / Self Care | Payer: Medicaid Other | Attending: Emergency Medicine | Admitting: Emergency Medicine

## 2017-11-03 ENCOUNTER — Encounter (HOSPITAL_BASED_OUTPATIENT_CLINIC_OR_DEPARTMENT_OTHER): Payer: Self-pay | Admitting: Emergency Medicine

## 2017-11-03 DIAGNOSIS — O99012 Anemia complicating pregnancy, second trimester: Secondary | ICD-10-CM | POA: Diagnosis not present

## 2017-11-03 DIAGNOSIS — Z79899 Other long term (current) drug therapy: Secondary | ICD-10-CM | POA: Insufficient documentation

## 2017-11-03 DIAGNOSIS — Z3A24 24 weeks gestation of pregnancy: Secondary | ICD-10-CM | POA: Insufficient documentation

## 2017-11-03 DIAGNOSIS — M62830 Muscle spasm of back: Secondary | ICD-10-CM | POA: Diagnosis not present

## 2017-11-03 DIAGNOSIS — O9989 Other specified diseases and conditions complicating pregnancy, childbirth and the puerperium: Secondary | ICD-10-CM | POA: Diagnosis present

## 2017-11-03 LAB — CBC WITH DIFFERENTIAL/PLATELET
Basophils Absolute: 0 10*3/uL (ref 0.0–0.1)
Basophils Relative: 0 %
EOS ABS: 0 10*3/uL (ref 0.0–0.7)
EOS PCT: 1 %
HCT: 26.3 % — ABNORMAL LOW (ref 36.0–46.0)
HEMOGLOBIN: 8.3 g/dL — AB (ref 12.0–15.0)
LYMPHS ABS: 1.4 10*3/uL (ref 0.7–4.0)
Lymphocytes Relative: 20 %
MCH: 26.3 pg (ref 26.0–34.0)
MCHC: 31.6 g/dL (ref 30.0–36.0)
MCV: 83.5 fL (ref 78.0–100.0)
MONOS PCT: 9 %
Monocytes Absolute: 0.7 10*3/uL (ref 0.1–1.0)
Neutro Abs: 5.1 10*3/uL (ref 1.7–7.7)
Neutrophils Relative %: 70 %
Platelets: 283 10*3/uL (ref 150–400)
RBC: 3.15 MIL/uL — ABNORMAL LOW (ref 3.87–5.11)
RDW: 14.2 % (ref 11.5–15.5)
WBC: 7.2 10*3/uL (ref 4.0–10.5)

## 2017-11-03 LAB — COMPREHENSIVE METABOLIC PANEL
ALK PHOS: 59 U/L (ref 38–126)
ALT: 10 U/L (ref 0–44)
AST: 18 U/L (ref 15–41)
Albumin: 2.6 g/dL — ABNORMAL LOW (ref 3.5–5.0)
Anion gap: 8 (ref 5–15)
BUN: 8 mg/dL (ref 6–20)
CALCIUM: 8.5 mg/dL — AB (ref 8.9–10.3)
CO2: 23 mmol/L (ref 22–32)
Chloride: 107 mmol/L (ref 98–111)
Creatinine, Ser: 0.48 mg/dL (ref 0.44–1.00)
GFR calc Af Amer: >60 mL/min (ref >60)
GFR calc non Af Amer: >60 mL/min (ref >60)
Glucose, Bld: 108 mg/dL — ABNORMAL HIGH (ref 70–99)
Potassium: 3.6 mmol/L (ref 3.5–5.1)
SODIUM: 138 mmol/L (ref 135–145)
TOTAL PROTEIN: 6.4 g/dL — AB (ref 6.5–8.1)
Total Bilirubin: 0.4 mg/dL (ref 0.3–1.2)

## 2017-11-03 NOTE — ED Provider Notes (Signed)
[facility] EMERGENCY DEPARTMENT Provider Note   CSN: [id number] Arrival date & time: 11/03/17  0908     History   Chief Complaint Chief Complaint  Patient presents with  . Back Pain    HPI Sherryll Skoczylas is a 27 y.o. female.  27 year old female, 6 months pregnant, presents with complaint of pain in her right back and right lower ribs.  Patient states that her symptoms started 2 weeks ago when she was seen here for a cough, her daughter was ill with similar symptoms and was treated by the pediatrician with antibiotics.  Patient notes cough is still present although improving however she originally had pain just around her right scapula and now has pain down through her right mid back area and around to her right anterior lower ribs, pain was intermittent for the past 2 weeks and has been constant since yesterday.  Patient also states that for the past 2 weeks she has had shortness of breath only when lying supine, now feels like she is short of breath more, no SHOB with exertion.  Denies recent extended travel, pain or swelling in her legs, fevers, chills, nausea, vomiting, pain is not worse with eating.  Patient reports normal fetal movement today, not leaking fluids or bleeding vaginally.  Denies history of similar symptoms previously or with prior to this pregnancies.  Patient is seen by OB in this building, has not been to her OB for this problem and has not called them.  No other complaints or concerns.     Past Medical History:  Diagnosis Date  . Medical history non-contributory     Patient Active Problem List   Diagnosis Date Noted  . Uterine size date discrepancy, antepartum 10/12/2017  . Breast symptom in antepartum period 10/12/2017  . Spotting affecting pregnancy 06/16/2017  . Cramping affecting pregnancy, antepartum 06/16/2017  . Supervision of other normal pregnancy, antepartum 11/01/2011    Past Surgical History:  Procedure Laterality Date  . NOSE  SURGERY       OB History    Gravida  3   Para  2   Term  2   Preterm      AB      Living  2     SAB      TAB      Ectopic      Multiple      Live Births  2            Home Medications    Prior to Admission medications   Medication Sig Start Date End Date Taking? Authorizing Provider  acetaminophen (TYLENOL) 325 MG tablet Take 325 mg by mouth every 6 (six) hours as needed for headache.    [provider]  butalbital-acetaminophen-caffeine (FIORICET WITH CODEINE) 50-325-40-30 MG capsule Take by mouth.    [provider]  Cholecalciferol (VITAMIN D3) 50000 units CAPS Take by mouth.    [provider]  folic acid (FOLVITE) 1 MG tablet Take 1 tablet (1 mg total) by mouth daily. 08/10/17   Conan Bowens, MD    Family History Family History  Problem Relation Age of Onset  . Cancer Paternal Uncle        blood CA  . Diabetes Neg Hx   . Hypertension Neg Hx     Social History Social History   Tobacco Use  . Smoking status: Never Smoker  . Smokeless tobacco: Never Used  Substance Use Topics  . Alcohol use: Never  Frequency: Never  . Drug use: Never     Allergies   Patient has no known allergies.   Review of Systems Review of Systems  Constitutional: Negative for chills, diaphoresis, fatigue and fever.  HENT: Negative for congestion, sinus pressure, sinus pain and sore throat.   Respiratory: Positive for cough and shortness of breath. Negative for chest tightness and wheezing.   Cardiovascular: Negative for palpitations and leg swelling.  Gastrointestinal: Negative for abdominal pain, nausea and vomiting.  Musculoskeletal: Positive for back pain. Negative for neck pain and neck stiffness.  Skin: Negative for rash and wound.  Allergic/Immunologic: Negative for immunocompromised state.  Neurological: Negative for dizziness, weakness and headaches.  Psychiatric/Behavioral: Negative for confusion.  All other systems reviewed  and are negative.    Physical Exam Updated Vital Signs BP 103/65 (BP Location: Right Arm)   Pulse 92   Temp 98.3 F (36.8 C) (Oral)   Resp 20   LMP 04/22/2017 (Exact Date)   SpO2 100%   Physical Exam  Constitutional: She is oriented to person, place, and time. She appears well-developed and well-nourished. No distress.  HENT:  Head: Normocephalic and atraumatic.  Neck: Neck supple.  Cardiovascular: Regular rhythm, normal heart sounds and intact distal pulses. Tachycardia present.  No murmur heard. Pulmonary/Chest: Effort normal and breath sounds normal. No respiratory distress. She has no wheezes. She exhibits tenderness.    Abdominal: Soft. There is no tenderness.  gravid  Musculoskeletal: She exhibits tenderness. She exhibits no deformity.       Back:  Lymphadenopathy:    She has no cervical adenopathy.  Neurological: She is alert and oriented to person, place, and time.  Skin: Skin is warm and dry. She is not diaphoretic.  Psychiatric: She has a normal mood and affect. Her behavior is normal.  Nursing note and vitals reviewed.    ED Treatments / Results  Labs (all labs ordered are listed, but only abnormal results are displayed) Labs Reviewed  CBC WITH DIFFERENTIAL/PLATELET - Abnormal; Notable for the following components:      Result Value   RBC 3.15 (*)    Hemoglobin 8.3 (*)    HCT 26.3 (*)    All other components within normal limits  COMPREHENSIVE METABOLIC PANEL - Abnormal; Notable for the following components:   Glucose, Bld 108 (*)    Calcium 8.5 (*)    Total Protein 6.4 (*)    Albumin 2.6 (*)    All other components within normal limits    EKG EKG Interpretation  Date/Time:  Thursday November 03 2017 09:37:21 EDT Ventricular Rate:  106 PR Interval:    QRS Duration: 85 QT Interval:  330 QTC Calculation: 439 R Axis:   64 Text Interpretation:  Sinus tachycardia RSR' in V1 or V2, right VCD or RVH Artifact No old tracing to compare Confirmed by  Pricilla Loveless [phone]) on 11/03/2017 9:52:41 AM   Radiology Dg Chest 2 View  Result Date: 11/03/2017 CLINICAL DATA:  Patient c/o cough x 2 weeks, slight sob, having some right scapular pains as well, patient was double shielded, no other complaints EXAM: CHEST - 2 VIEW COMPARISON:  None. FINDINGS: The heart size and mediastinal contours are within normal limits. Both lungs are clear. The visualized skeletal structures are unremarkable. IMPRESSION: No active cardiopulmonary disease. Electronically Signed   By: Norva Pavlov M.D.   On: 11/03/2017 10:39    Procedures Procedures (including critical care time)  Medications Ordered in ED Medications - No data to display  Initial Impression / Assessment and Plan / ED Course  I have reviewed the triage vital signs and the nursing notes.  Pertinent labs & imaging results that were available during my care of the patient were reviewed by me and considered in my medical decision making (see chart for details).  Clinical Course as of Nov 03 1152  Thu Nov 03, 2017  1145 27yo female, 6 months pregnant, complaint of pain in her right scapula, right mid back and right lower ribs. On exam, tenderness with palpation right upper to mid back, states palpation reproduces the pain she is experiencing, pain is also worse with ROM of the right shoulder. Also TTP right mid axillary and mid clavicular lower ribs. No abdominal tenderness, pain not related to eating, do not suspect cholelithiasis or cholecystitis. Lungs are clear, no pleuritic pain. CXR normal, no evidence of pneumonia. Labs with unremarkable CMP (normal LFTs), CBC with normal platelets, does show anemia with HGB 8.3. Review of prior CBCs- last check HGB in the 10s while pregnant, prior to pregnancy, HGB 11. Discussed with patient, recommend prenatal vitamin with iron, discussed possible constipation with the iron. Patient was also seen by Dr. Criss AlvineGoldston, ER attending. Agrees with plan of care. Pain  reproduced with palpation and movement, no PE risk factors other than pregnancy, would like to limit radiation at this time based on presentation of symptoms today.       [LM]    Clinical Course User Index [LM] Jeannie FendMurphy, Yvone Slape A, PA-C    Final Clinical Impressions(s) / ED Diagnoses   Final diagnoses:  Muscle spasm of back  Anemia during pregnancy in second trimester    ED Discharge Orders    None       Jeannie FendMurphy, Rogene Meth A, PA-C 11/03/17 1154    Pricilla LovelessGoldston, Scott, MD 11/04/17 [phone]

## 2017-11-03 NOTE — ED Notes (Signed)
Patient transported to X-ray 

## 2017-11-03 NOTE — Discharge Instructions (Addendum)
Take Tylenol as needed as directed for pain.  Apply warm compresses to the sore muscles of your back for no longer than 20 minutes at a time. Take a prenatal vitamin with iron in it for your anemia.    **Follow-up with your OB, call their office today to schedule follow-up within the next week.** Return to the emergency room or [facility] for emergent care for any worsening or concerning symptoms.

## 2017-11-03 NOTE — ED Triage Notes (Signed)
Right scapula pain x 2 weeks. Was seen here on 9/2 for cough. States no longer has a cough but is having SOB. 6 months pregnant, denies abd pain

## 2017-11-03 NOTE — ED Notes (Signed)
ED Provider at bedside. 

## 2017-11-08 ENCOUNTER — Ambulatory Visit (INDEPENDENT_AMBULATORY_CARE_PROVIDER_SITE_OTHER): Payer: Medicaid Other

## 2017-11-08 VITALS — BP 96/69 | HR 97 | Wt 162.0 lb

## 2017-11-08 DIAGNOSIS — O99019 Anemia complicating pregnancy, unspecified trimester: Secondary | ICD-10-CM

## 2017-11-08 DIAGNOSIS — N644 Mastodynia: Secondary | ICD-10-CM

## 2017-11-08 DIAGNOSIS — O26849 Uterine size-date discrepancy, unspecified trimester: Secondary | ICD-10-CM

## 2017-11-08 DIAGNOSIS — Z3480 Encounter for supervision of other normal pregnancy, unspecified trimester: Secondary | ICD-10-CM

## 2017-11-08 MED ORDER — FERROUS SULFATE 325 (65 FE) MG PO TABS: 325.0000 mg | ORAL_TABLET | Freq: Every day | ORAL | 0 refills | Status: DC

## 2017-11-08 MED FILL — FERROUS SULFATE 325 MG TAB: 325 (65 FE) | 100 days supply | Qty: 100 | Fill #0

## 2017-11-08 NOTE — Patient Instructions (Signed)
Safe Medications in Pregnancy   Acne: Benzoyl Peroxide Salicylic Acid  Backache/Headache: Tylenol: 2 regular strength every 4 hours OR              2 Extra strength every 6 hours  Colds/Coughs/Allergies: Benadryl (alcohol free) 25 mg every 6 hours as needed Breath right strips Claritin Cepacol throat lozenges Chloraseptic throat spray Cold-Eeze- up to three times per day Cough drops, alcohol free Flonase (by prescription only) Guaifenesin Mucinex Robitussin DM (plain only, alcohol free) Saline nasal spray/drops Sudafed (pseudoephedrine) & Actifed ** use only after [redacted] weeks gestation and if you do not have high blood pressure Tylenol Vicks Vaporub Zinc lozenges Zyrtec   Constipation: Colace Ducolax suppositories Fleet enema Glycerin suppositories Metamucil Milk of magnesia Miralax Senokot Smooth move tea  Diarrhea: Kaopectate Imodium A-D  *NO pepto Bismol  Hemorrhoids: Anusol Anusol HC Preparation H Tucks  Indigestion: Tums Maalox Mylanta Zantac  Pepcid  Insomnia: Benadryl (alcohol free) 25mg every 6 hours as needed Tylenol PM Unisom, no Gelcaps  Leg Cramps: Tums MagGel  Nausea/Vomiting:  Bonine Dramamine Emetrol Ginger extract Sea bands Meclizine  Nausea medication to take during pregnancy:  Unisom (doxylamine succinate 25 mg tablets) Take one tablet daily at bedtime. If symptoms are not adequately controlled, the dose can be increased to a maximum recommended dose of two tablets daily (1/2 tablet in the morning, 1/2 tablet mid-afternoon and one at bedtime). Vitamin B6 100mg tablets. Take one tablet twice a day (up to 200 mg per day).  Skin Rashes: Aveeno products Benadryl cream or 25mg every 6 hours as needed Calamine Lotion 1% cortisone cream  Yeast infection: Gyne-lotrimin 7 Monistat 7   **If taking multiple medications, please check labels to avoid duplicating the same active ingredients **take medication as directed on  the label ** Do not exceed 4000 mg of tylenol in 24 hours **Do not take medications that contain aspirin or ibuprofen    Glucose Tolerance Test During Pregnancy The glucose tolerance test (GTT) is a blood test used to determine if you have developed a type of diabetes during pregnancy (gestational diabetes). This is when your body does not properly process sugar (glucose) in the food you eat, resulting in high blood glucose levels. Typically, a GTT is done after you have had a 1-hour glucose test with results that indicate you possibly have gestational diabetes. It may also be done if:  You have a history of giving birth to very large babies or have experienced repeated fetal loss (stillbirth).  You have signs and symptoms of diabetes, such as: ? Changes in your vision. ? Tingling or numbness in your hands or feet. ? Changes in hunger, thirst, and urination not otherwise explained by your pregnancy.  The GTT lasts about 3 hours. You will be given a sugar-water solution to drink at the beginning of the test. You will have blood drawn before you drink the solution and then again 1, 2, and 3 hours after you drink it. You will not be allowed to eat or drink anything else during the test. You must remain at the testing location to make sure that your blood is drawn on time. You should also avoid exercising during the test, because exercise can alter test results. How do I prepare for this test? Eat normally for 3 days prior to the GTT test, including having plenty of carbohydrate-rich foods. Do not eat or drink anything except water during the final 12 hours before the test. In addition, your health care provider   may ask you to stop taking certain medicines before the test. What do the results mean? It is your responsibility to obtain your test results. Ask the lab or department performing the test when and how you will get your results. Contact your health care provider to discuss any questions you  have about your results. Range of Normal Values Ranges for normal values may vary among different labs and hospitals. You should always check with your health care provider after having lab work or other tests done to discuss whether your values are considered within normal limits. Normal levels of blood glucose are as follows:  Fasting: less than 105 mg/dL.  1 hour after drinking the solution: less than 190 mg/dL.  2 hours after drinking the solution: less than 165 mg/dL.  3 hours after drinking the solution: less than 145 mg/dL.  Some substances can interfere with GTT results. These may include:  Blood pressure and heart failure medicines, including beta blockers, furosemide, and thiazides.  Anti-inflammatory medicines, including aspirin.  Nicotine.  Some psychiatric medicines.  Meaning of Results Outside Normal Value Ranges GTT test results that are above normal values may indicate a number of health problems, such as:  Gestational diabetes.  Acute stress response.  Cushing syndrome.  Tumors such as pheochromocytoma or glucagonoma.  Long-term kidney problems.  Pancreatitis.  Hyperthyroidism.  Current infection.  Discuss your test results with your health care provider. He or she will use the results to make a diagnosis and determine a treatment plan that is right for you. This information is not intended to replace advice given to you by your health care provider. Make sure you discuss any questions you have with your health care provider. Document Released: 08/10/2011 Document Revised: 07/17/2015 Document Reviewed: 06/15/2013 Elsevier Interactive Patient Education  2018 Elsevier Inc[employer]  

## 2017-11-08 NOTE — Progress Notes (Signed)
   PRENATAL VISIT NOTE  Subjective:  Bonnie Cross is a 27 y.o. G3P2002 at 414w4d being seen today for ongoing prenatal care.  She is currently monitored for the following issues for this low-risk pregnancy and has Spotting affecting pregnancy; Cramping affecting pregnancy, antepartum; Supervision of other normal pregnancy, antepartum; Uterine size date discrepancy, antepartum; and Breast symptom in antepartum period on their problem list.  Patient reports backache.  Contractions: Not present. Vag. Bleeding: None.  Movement: Present. Denies leaking of fluid.   The following portions of the patient's history were reviewed and updated as appropriate: allergies, current medications, past family history, past medical history, past social history, past surgical history and problem list. Problem list updated.  Objective:   Vitals:   11/08/17 0855  BP: 96/69  Pulse: 97  Weight: 162 lb (73.5 kg)    Fetal Status: Fetal Heart Rate (bpm): 140 Fundal Height: 32 cm Movement: Present     General:  Alert, oriented and cooperative. Patient is in no acute distress.  Skin: Skin is warm and dry. No rash noted.   Cardiovascular: Normal heart rate noted  Respiratory: Normal respiratory effort, no problems with respiration noted  Abdomen: Soft, gravid, appropriate for gestational age.  Pain/Pressure: Absent     Pelvic: Cervical exam deferred        Extremities: Normal range of motion.  Edema: Trace  Mental Status: Normal mood and affect. Normal behavior. Normal judgment and thought content.   Assessment and Plan:  Pregnancy: G3P2002 at 764w4d  1. Supervision of other normal pregnancy, antepartum - CBC - Glucose Tolerance, 2 Hours w/1 Hour - HIV Antibody (routine testing w rflx) - RPR  -Discussed with patient use of maternity support belt to help with posture and comfort measures for back pain in pregnancy reviewed  2. Uterine size date discrepancy, antepartum - Size greater than dates again  today. Will repeat u/s. - US MFM OB FOLLOW UP; Future  3. Breast pain - Patient denies pain or nodules noted at last visit. Will continue to monitor.   4. Anemia in pregnancy - Patient seen at urgent care and found to have Hgb of 8.6. Will repeat with GTT today and start iron supplements.   Preterm labor symptoms and general obstetric precautions including but not limited to vaginal bleeding, contractions, leaking of fluid and fetal movement were reviewed in detail with the patient. Please refer to After Visit Summary for other counseling recommendations.  Return in about 2 weeks (around 11/22/2017) for Return OB visit.  Future Appointments  Date Time Provider Department Center  11/22/2017  8:15 AM Aviva SignsWilliams, Marie L, CNM [facility] None  11/22/2017 11:00 AM [facility] US 3 [facility]    Rolm BookbinderCaroline M Gwynn Chalker, [place]  11/08/17 10:28 AM

## 2017-11-09 LAB — CBC
Hematocrit: 26.1 % — ABNORMAL LOW (ref 34.0–46.6)
Hemoglobin: 8.1 g/dL — ABNORMAL LOW (ref 11.1–15.9)
MCH: 25.3 pg — AB (ref 26.6–33.0)
MCHC: 31 g/dL — AB (ref 31.5–35.7)
MCV: 82 fL (ref 79–97)
Platelets: 281 10*3/uL (ref 150–450)
RBC: 3.2 x10E6/uL — ABNORMAL LOW (ref 3.77–5.28)
RDW: 14.4 % (ref 12.3–15.4)
WBC: 7 10*3/uL (ref 3.4–10.8)

## 2017-11-09 LAB — RPR: RPR Ser Ql: NONREACTIVE

## 2017-11-09 LAB — HIV ANTIBODY (ROUTINE TESTING W REFLEX): HIV Screen 4th Generation wRfx: NONREACTIVE

## 2017-11-09 LAB — GLUCOSE TOLERANCE, 2 HOURS W/ 1HR
GLUCOSE, 2 HOUR: 125 mg/dL (ref 65–152)
Glucose, 1 hour: 144 mg/dL (ref 65–179)
Glucose, Fasting: 83 mg/dL (ref 65–91)

## 2017-11-22 ENCOUNTER — Encounter: Payer: Medicaid Other | Admitting: Advanced Practice Midwife

## 2017-11-22 ENCOUNTER — Encounter: Payer: Self-pay | Admitting: Advanced Practice Midwife

## 2017-11-22 ENCOUNTER — Ambulatory Visit (INDEPENDENT_AMBULATORY_CARE_PROVIDER_SITE_OTHER): Payer: Medicaid Other | Admitting: Advanced Practice Midwife

## 2017-11-22 ENCOUNTER — Ambulatory Visit (HOSPITAL_COMMUNITY)
Admission: RE | Admit: 2017-11-22 | Discharge: 2017-11-22 | Disposition: A | Discharge status: Home / Self Care | Payer: Medicaid Other | Source: Ambulatory Visit

## 2017-11-22 VITALS — BP 108/63 | HR 100 | Wt 164.0 lb

## 2017-11-22 DIAGNOSIS — N6310 Unspecified lump in the right breast, unspecified quadrant: Secondary | ICD-10-CM

## 2017-11-22 DIAGNOSIS — Z3480 Encounter for supervision of other normal pregnancy, unspecified trimester: Secondary | ICD-10-CM

## 2017-11-22 DIAGNOSIS — O26843 Uterine size-date discrepancy, third trimester: Secondary | ICD-10-CM | POA: Diagnosis not present

## 2017-11-22 DIAGNOSIS — Z362 Encounter for other antenatal screening follow-up: Secondary | ICD-10-CM | POA: Diagnosis not present

## 2017-11-22 DIAGNOSIS — Z3A30 30 weeks gestation of pregnancy: Secondary | ICD-10-CM | POA: Insufficient documentation

## 2017-11-22 DIAGNOSIS — O26849 Uterine size-date discrepancy, unspecified trimester: Secondary | ICD-10-CM

## 2017-11-22 NOTE — Patient Instructions (Addendum)
Breast Cyst A breast cyst is a sac in the breast that is filled with fluid. Breast cysts are usually noncancerous (benign). They are common among women, and they are most often located in the upper, outer portion of the breast. One or more cysts may develop. They form when fluid builds up inside of the breast glands. There are several types of breast cysts:  Macrocyst. This is a cyst that is about 2 inches (5.1 cm) across (in diameter).  Microcyst. This is a very small cyst that you cannot feel, but it can be seen with imaging tests such as an X-ray of the breast (mammogram) or ultrasound.  Galactocele. This is a cyst that contains milk. It may develop if you suddenly stop breastfeeding.  Breast cysts do not increase your risk of breast cancer. They usually disappear after menopause, unless you take artificial hormones (are on hormone therapy). What are the causes? The exact cause of breast cysts is not known. Possible causes include:  Blockage of tubes (ducts) in the breast glands, which leads to fluid buildup. Duct blockage may result from: ? Fibrocystic breast changes. This is a common, benign condition that occurs when women go through hormonal changes during the menstrual cycle. This is a common cause of multiple breast cysts. ? Overgrowth of breast tissue or breast glands. ? Scar tissue in the breast from previous surgery.  Changes in certain female hormones (estrogen and progesterone).  What increases the risk? You may be more likely to develop breast cysts if you have not gone through menopause. What are the signs or symptoms? Symptoms of a breast cyst may include:  Feeling one or more smooth, round, soft lumps (like grapes) in the breast that are easily moveable. The lump(s) may get bigger and more painful before your period and get smaller after your period.  Breast discomfort or pain.  How is this diagnosed? A cyst can be felt during a physical exam by your health care  provider. A mammogram and ultrasound will be done to confirm the diagnosis. Fluid may be removed from the cyst with a needle (fine-needle aspiration) and tested to make sure the cyst is not cancerous. How is this treated? Treatment may not be necessary. Your health care provider may monitor the cyst to see if it goes away on its own. If the cyst is uncomfortable or gets bigger, or if you do not like how the cyst makes your breast look, you may need treatment. Treatment may include:  Hormone treatment.  Fine-needle aspiration, to drain fluid from the cyst. There is a chance of the cyst coming back (recurring) after aspiration.  Surgery to remove the cyst.  Follow these instructions at home:  See your health care provider regularly. ? Get a yearly physical exam. ? If you are 20-40 years old, get a clinical breast exam every 1-3 years. After age 40, get this exam every year. ? Get mammograms as often as directed.  Do a breast self-exam every month, or as often as directed. Having many breast cysts, or "lumpy" breasts, may make it harder to feel for new lumps. Understand how your breasts normally look and feel, and write down any changes in your breasts so you can tell your health care provider about the changes. A breast self-exam involves: ? Comparing your breasts in the mirror. ? Looking for visible changes in your skin or nipples. ? Feeling for lumps or changes.  Take over-the-counter and prescription medicines only as told by your health care   provider.  Wear a supportive bra, especially when exercising.  Follow instructions from your health care provider about eating and drinking restrictions. ? Avoid caffeine. ? Cut down on salt (sodium) in what you eat and drink, especially before your menstrual period. Too much sodium can cause fluid buildup (retention), breast swelling, and discomfort.  Keep all follow-up visits as told your health care provider. This is important. Contact a  health care provider if:  You feel, or think you feel, a lump in your breast.  You notice that both breasts look or feel different than usual.  Your breast is still causing pain after your menstrual period is over.  You find new lumps or bumps that were not there before.  You feel lumps in your armpit (axilla). Get help right away if:  You have severe pain, tenderness, redness, or warmth in your breast.  You have fluid or blood leaking from your nipple.  Your breast lump becomes hard and painful.  You notice dimpling or wrinkling of the breast or nipple. This information is not intended to replace advice given to you by your health care provider. Make sure you discuss any questions you have with your health care provider. Document Released: 02/08/2005 Document Revised: 10/31/2015 Document Reviewed: 10/31/2015 Elsevier Interactive Patient Education  2017 [employer]. Third Trimester of Pregnancy The third trimester is from week 29 through week 42, months 7 through 9. This trimester is when your unborn baby (fetus) is growing very fast. At the end of the ninth month, the unborn baby is about 20 inches in length. It weighs about 6-10 pounds. Follow these instructions at home:  Avoid all smoking, herbs, and alcohol. Avoid drugs not approved by your doctor.  Do not use any tobacco products, including cigarettes, chewing tobacco, and electronic cigarettes. If you need help quitting, ask your doctor. You may get counseling or other support to help you quit.  Only take medicine as told by your doctor. Some medicines are safe and some are not during pregnancy.  Exercise only as told by your doctor. Stop exercising if you start having cramps.  Eat regular, healthy meals.  Wear a good support bra if your breasts are tender.  Do not use hot tubs, steam rooms, or saunas.  Wear your seat belt when driving.  Avoid raw meat, uncooked cheese, and liter boxes and soil used by cats.  Take  your prenatal vitamins.  Take 1500-2000 milligrams of calcium daily starting at the 20th week of pregnancy until you deliver your baby.  Try taking medicine that helps you poop (stool softener) as needed, and if your doctor approves. Eat more fiber by eating fresh fruit, vegetables, and whole grains. Drink enough fluids to keep your pee (urine) clear or pale yellow.  Take warm water baths (sitz baths) to soothe pain or discomfort caused by hemorrhoids. Use hemorrhoid cream if your doctor approves.  If you have puffy, bulging veins (varicose veins), wear support hose. Raise (elevate) your feet for 15 minutes, 3-4 times a day. Limit salt in your diet.  Avoid heavy lifting, wear low heels, and sit up straight.  Rest with your legs raised if you have leg cramps or low back pain.  Visit your dentist if you have not gone during your pregnancy. Use a soft toothbrush to brush your teeth. Be gentle when you floss.  You can have sex (intercourse) unless your doctor tells you not to.  Do not travel far distances unless you must. Only do so with  your doctor's approval.  Take prenatal classes.  Practice driving to the hospital.  Pack your hospital bag.  Prepare the baby's room.  Go to your doctor visits. Get help if:  You are not sure if you are in labor or if your water has broken.  You are dizzy.  You have mild cramps or pressure in your lower belly (abdominal).  You have a nagging pain in your belly area.  You continue to feel sick to your stomach (nauseous), throw up (vomit), or have watery poop (diarrhea).  You have bad smelling fluid coming from your vagina.  You have pain with peeing (urination). Get help right away if:  You have a fever.  You are leaking fluid from your vagina.  You are spotting or bleeding from your vagina.  You have severe belly cramping or pain.  You lose or gain weight rapidly.  You have trouble catching your breath and have chest pain.  You  notice sudden or extreme puffiness (swelling) of your face, hands, ankles, feet, or legs.  You have not felt the baby move in over an hour.  You have severe headaches that do not go away with medicine.  You have vision changes. This information is not intended to replace advice given to you by your health care provider. Make sure you discuss any questions you have with your health care provider. Document Released: 05/05/2009 Document Revised: 07/17/2015 Document Reviewed: 04/11/2012 Elsevier Interactive Patient Education  2017 [employer].

## 2017-11-22 NOTE — Progress Notes (Signed)
   PRENATAL VISIT NOTE  Subjective:  Bonnie Cross is a 27 y.o. G3P2002 at [redacted]w[redacted]d being seen today for ongoing prenatal care.  She is currently monitored for the following issues for this low-risk pregnancy and has Spotting affecting pregnancy; Cramping affecting pregnancy, antepartum; Supervision of other normal pregnancy, antepartum; Uterine size date discrepancy, antepartum; and Breast symptom in antepartum period on their problem list.  Patient reports no complaints.  Contractions: Not present. Vag. Bleeding: None.  Movement: Present. Denies leaking of fluid.   The following portions of the patient's history were reviewed and updated as appropriate: allergies, current medications, past family history, past medical history, past social history, past surgical history and problem list. Problem list updated.  Objective:   Vitals:   11/22/17 0806  BP: 108/63  Pulse: 100  Weight: 74.4 kg    Fetal Status: Fetal Heart Rate (bpm): 152 Fundal Height: 31 cm Movement: Present     General:  Alert, oriented and cooperative. Patient is in no acute distress.  Skin: Skin is warm and dry. No rash noted.   Cardiovascular: Normal heart rate noted  Respiratory: Normal respiratory effort, no problems with respiration noted  Abdomen: Soft, gravid, appropriate for gestational age.  Pain/Pressure: Present     Pelvic: Cervical exam deferred        Extremities: Normal range of motion.  Edema: Trace  Mental Status: Normal mood and affect. Normal behavior. Normal judgment and thought content.   Assessment and Plan:  Pregnancy: G3P2002 at [redacted]w[redacted]d  1. Breast mass, right     Offered Breast US.  Has had several tender lumps on outer right for a while.  Wants to wait two more weeks to see if they get better. Likely galactoceles  2. Supervision of other normal pregnancy, antepartum     Reviewed signs of PTL  Preterm labor symptoms and general obstetric precautions including but not limited to vaginal  bleeding, contractions, leaking of fluid and fetal movement were reviewed in detail with the patient. Please refer to After Visit Summary for other counseling recommendations.  Return in about 2 weeks (around 12/06/2017) for [employer].  Future Appointments  Date Time Provider Department Center  12/06/2017  8:50 AM Katrinka Blazing, [place], CNM [facility] None    Wynelle Bourgeois, CNM

## 2017-12-02 ENCOUNTER — Telehealth: Payer: Self-pay

## 2017-12-02 NOTE — Telephone Encounter (Signed)
Returned patient call -   Patient reports internal itching for the last to days - tried Tylenol  With no relief. Advised patient that she could  try OTC Benadryl as well for itching relief and to help her rest.  Patient stated she had been reading on the Internet about ICP (Intrahepatic Cholestasis) and would like to have blood work done to make sure her liver is okay. Advised patient to keep appt on 12/06/17, to discuss her concerns with the provider to see if lab work should be done. Also, advised patient if itching get worse, fatigue, exhaustion, loss of appetite, dark urine, pale/light coloring of bowel movement, right upper quadrant pain go to [facility] for evaluation and treatment.   Patient stated she understood and would keep the appt.

## 2017-12-06 ENCOUNTER — Ambulatory Visit (INDEPENDENT_AMBULATORY_CARE_PROVIDER_SITE_OTHER): Payer: Medicaid Other | Admitting: Advanced Practice Midwife

## 2017-12-06 VITALS — BP 107/66 | HR 101 | Wt 165.8 lb

## 2017-12-06 DIAGNOSIS — Z3480 Encounter for supervision of other normal pregnancy, unspecified trimester: Secondary | ICD-10-CM

## 2017-12-06 DIAGNOSIS — O3680X0 Pregnancy with inconclusive fetal viability, not applicable or unspecified: Secondary | ICD-10-CM | POA: Insufficient documentation

## 2017-12-06 DIAGNOSIS — L299 Pruritus, unspecified: Secondary | ICD-10-CM

## 2017-12-06 DIAGNOSIS — O99013 Anemia complicating pregnancy, third trimester: Secondary | ICD-10-CM

## 2017-12-06 DIAGNOSIS — O99019 Anemia complicating pregnancy, unspecified trimester: Secondary | ICD-10-CM | POA: Insufficient documentation

## 2017-12-06 MED ORDER — DIPHENHYDRAMINE HCL 25 MG PO TABS: 25.0000 mg | ORAL_TABLET | Freq: Four times a day (QID) | ORAL | 1 refills | Status: DC | PRN

## 2017-12-06 MED FILL — BANOPHEN 25 MG CAPSULE: 25 | 13 days supply | Qty: 100 | Fill #0

## 2017-12-06 NOTE — Patient Instructions (Addendum)
Cholestasis of Pregnancy Cholestasis refers to any condition that causes the flow of digestive fluid (bile) produced by the liver to slow down or stop. Cholestasis of pregnancy is most common toward the end of pregnancy (thirdtrimester), but it can occur any time during pregnancy. The condition often goes away soon after giving birth. Cholestasis may be uncomfortable, but it is usually harmless to you. However, it can be harmful to your baby. Cholestasis may increase the risk of:  Your baby being born too early (preterm delivery).  Your baby having a slow heart rate and lack of oxygen during delivery (fetal distress).  Losing your baby before delivery (stillbirth).  What are the causes? The exact cause of this condition is not known, but it may be related to:  Pregnancy hormones. The gallbladder normally holds bile until you need it to help digest fat in your diet. Pregnancy hormones may cause the flow of bile to slow down and back up into your liver. Bile may then get into your bloodstream and cause cholestasis symptoms.  Changes in your genes (genetic mutations). Specifically, genes that affect how the liver releases bile.  What increases the risk? You are more likely to develop this condition if:  You had cholestasis during a previous pregnancy.  You have a family history of cholestasis.  You have liver problems.  You are having multiple babies, such as twins or triplets.  What are the signs or symptoms? The most common symptom of this condition is intense itching (pruritus), especially on the palms of your hands and soles of your feet. The itching can spread to the rest of your body and is often worse at night. You will not usually have a rash. Other symptoms may include:  Feeling tired.  Pain in your upper right abdomen.  Dark-colored urine.  Light-colored stools.  Poor appetite.  Yellowish discoloration of your skin and the whites of your eyes (jaundice).  How is this  diagnosed? This condition is diagnosed based on:  Your medical history.  A physical exam.  Blood tests.  If you have an inherited risk for developing this condition, you may also have genetic testing. How is this treated? The goal of treatment is to make you comfortable and keep your baby safe. Your health care provider may:  Prescribe medicine to reduce bile acid in your bloodstream, relieve symptoms, and help keep your baby safe.  Give you vitamin K before delivery to prevent excessive bleeding.  Check your baby frequently (fetal monitoring).  Perform regular blood tests to check your bile levels and liver function until your baby is delivered.  Recommend starting (inducing) your labor and delivery by week 36 or 37 of pregnancy, or as soon as your baby's lungs have developed enough.  Follow these instructions at home:  Take over-the-counter and prescription medicines only as told by your health care provider.  Take cool baths to soothe itchy skin.  Wear comfortable, loose-fitting, cotton clothing to reduce itching.  Keep your fingernails short to prevent skin irritation from scratching.  Keep all follow-up visits and prenatal visits as told by your health care provider. This is important. Contact a health care provider if:  Your symptoms get worse, even with treatment.  You develop pain in your right side.  You have unusual swelling in your abdomen, feet, ankles, or legs.  You have a fever.  You are more thirsty than usual. Get help right away if:  You go into early labor.  You have a headache that   does not go away or causes changes in vision.  You have nausea or you vomit.  You have severe pain in your abdomen or shoulders.  You have shortness of breath. Summary  Cholestasis of pregnancy is most common toward the end of pregnancy (thirdtrimester), but it can occur any time during your pregnancy.  The condition often goes away soon after your baby is  born.  The most common symptom of cholestasis of pregnancy is intense itching (pruritus), especially on the palms of your hands and soles of your feet.  This condition may be treated with medicine, frequent monitoring, or starting (inducing) labor and delivery by week 36 or 37 of pregnancy. This information is not intended to replace advice given to you by your health care provider. Make sure you discuss any questions you have with your health care provider. Document Released: 02/06/2000 Document Revised: 01/24/2016 Document Reviewed: 01/24/2016 Elsevier Interactive Patient Education  2017 Elsevier Inc.   Ferumoxytol injection Duncan Dull) Iron Injection What is this medicine? FERUMOXYTOL is an iron complex. Iron is used to make healthy red blood cells, which carry oxygen and nutrients throughout the body. This medicine is used to treat iron deficiency anemia in people with chronic kidney disease. This medicine may be used for other purposes; ask your health care provider or pharmacist if you have questions. COMMON BRAND NAME(S): Feraheme What should I tell my health care provider before I take this medicine? They need to know if you have any of these conditions: -anemia not caused by low iron levels -high levels of iron in the blood -magnetic resonance imaging (MRI) test scheduled -an unusual or allergic reaction to iron, other medicines, foods, dyes, or preservatives -pregnant or trying to get pregnant -breast-feeding How should I use this medicine? This medicine is for injection into a vein. It is given by a health care professional in a hospital or clinic setting. Talk to your pediatrician regarding the use of this medicine in children. Special care may be needed. Overdosage: If you think you have taken too much of this medicine contact a poison control center or emergency room at once. NOTE: This medicine is only for you. Do not share this medicine with others. What if I miss a  dose? It is important not to miss your dose. Call your doctor or health care professional if you are unable to keep an appointment. What may interact with this medicine? This medicine may interact with the following medications: -other iron products This list may not describe all possible interactions. Give your health care provider a list of all the medicines, herbs, non-prescription drugs, or dietary supplements you use. Also tell them if you smoke, drink alcohol, or use illegal drugs. Some items may interact with your medicine. What should I watch for while using this medicine? Visit your doctor or healthcare professional regularly. Tell your doctor or healthcare professional if your symptoms do not start to get better or if they get worse. You may need blood work done while you are taking this medicine. You may need to follow a special diet. Talk to your doctor. Foods that contain iron include: whole grains/cereals, dried fruits, beans, or peas, leafy green vegetables, and organ meats (liver, kidney). What side effects may I notice from receiving this medicine? Side effects that you should report to your doctor or health care professional as soon as possible: -allergic reactions like skin rash, itching or hives, swelling of the face, lips, or tongue -breathing problems -changes in blood pressure -feeling  faint or lightheaded, falls -fever or chills -flushing, sweating, or hot feelings -swelling of the ankles or feet Side effects that usually do not require medical attention (report to your doctor or health care professional if they continue or are bothersome): -diarrhea -headache -nausea, vomiting -stomach pain This list may not describe all possible side effects. Call your doctor for medical advice about side effects. You may report side effects to FDA at 1-800-FDA-1088. Where should I keep my medicine? This drug is given in a hospital or clinic and will not be stored at home. NOTE:  This sheet is a summary. It may not cover all possible information. If you have questions about this medicine, talk to your doctor, pharmacist, or health care provider.  2018 Elsevier/Gold Standard (2015-03-13 12:41:49)   TDaP Vaccine Pregnancy Get the Whooping Cough Vaccine While You Are Pregnant (CDC)  It is important for women to get the whooping cough vaccine in the third trimester of each pregnancy. Vaccines are the best way to prevent this disease. There are 2 different whooping cough vaccines. Both vaccines combine protection against whooping cough, tetanus and diphtheria, but they are for different age groups: Tdap: for everyone 11 years or older, including pregnant women  DTaP: for children 2 months through 54 years of age  You need the whooping cough vaccine during each of your pregnancies The recommended time to get the shot is during your 27th through 36th week of pregnancy, preferably during the earlier part of this time period. The Centers for Disease Control and Prevention (CDC) recommends that pregnant women receive the whooping cough vaccine for adolescents and adults (called Tdap vaccine) during the third trimester of each pregnancy. The recommended time to get the shot is during your 27th through 36th week of pregnancy, preferably during the earlier part of this time period. This replaces the original recommendation that pregnant women get the vaccine only if they had not previously received it. The [employer]nec[employer] and the [employer] support this recommendation.  You should get the whooping cough vaccine while pregnant to pass protection to your baby frame support disabled and/or not supported in this browser  Learn why Vernona Rieger decided to get the whooping cough vaccine in her 3rd trimester of pregnancy and how her baby girl was born with some protection against the disease. Also available on YouTube. After receiving the  whooping cough vaccine, your body will create protective antibodies (proteins produced by the body to fight off diseases) and pass some of them to your baby before birth. These antibodies provide your baby some short-term protection against whooping cough in early life. These antibodies can also protect your baby from some of the more serious complications that come along with whooping cough. Your protective antibodies are at their highest about 2 weeks after getting the vaccine, but it takes time to pass them to your baby. So the preferred time to get the whooping cough vaccine is early in your third trimester. The amount of whooping cough antibodies in your body decreases over time. That is why CDC recommends you get a whooping cough vaccine during each pregnancy. Doing so allows each of your babies to get the greatest number of protective antibodies from you. This means each of your babies will get the best protection possible against this disease.  Getting the whooping cough vaccine while pregnant is better than getting the vaccine after you give birth Whooping cough vaccination during pregnancy is ideal so your baby will  have short-term protection as soon as he is born. This early protection is important because your baby will not start getting his whooping cough vaccines until he is 2 months old. These first few months of life are when your baby is at greatest risk for catching whooping cough. This is also when he's at greatest risk for having severe, potentially life-threating complications from the infection. To avoid that gap in protection, it is best to get a whooping cough vaccine during pregnancy. You will then pass protection to your baby before he is born. To continue protecting your baby, he should get whooping cough vaccines starting at 2 months old. You may never have gotten the Tdap vaccine before and did not get it during this pregnancy. If so, you should make sure to get the vaccine  immediately after you give birth, before leaving the hospital or birthing center. It will take about 2 weeks before your body develops protection (antibodies) in response to the vaccine. Once you have protection from the vaccine, you are less likely to give whooping cough to your newborn while caring for him. But remember, your baby will still be at risk for catching whooping cough from others. A recent study looked to see how effective Tdap was at preventing whooping cough in babies whose mothers got the vaccine while pregnant or in the hospital after giving birth. The study found that getting Tdap between 27 through 36 weeks of pregnancy is 85% more effective at preventing whooping cough in babies younger than 2 months old. Blood tests cannot tell if you need a whooping cough vaccine There are no blood tests that can tell you if you have enough antibodies in your body to protect yourself or your baby against whooping cough. Even if you have been sick with whooping cough in the past or previously received the vaccine, you still should get the vaccine during each pregnancy. Breastfeeding may pass some protective antibodies onto your baby By breastfeeding, you may pass some antibodies you have made in response to the vaccine to your baby. When you get a whooping cough vaccine during your pregnancy, you will have antibodies in your breast milk that you can share with your baby as soon as your milk comes in. However, your baby will not get protective antibodies immediately if you wait to get the whooping cough vaccine until after delivering your baby. This is because it takes about 2 weeks for your body to create antibodies. Learn more about the health benefits of breastfeeding.    Pregnancy and Influenza Influenza, also called the flu, is an infection of the respiratory tract. If you are pregnant, you are more likely to catch the flu. You are also more likely to have a more serious case of the flu. This is  because pregnancy lowers your body's ability to fight off infections (it weakens your immune system). It also puts additional stress on your heart and lungs, which makes you more likely to have complications. Having a bad case of the flu, especially with a high fever, can be dangerous for your developing baby. It can cause you to go into early labor. How do people get the flu? The flu is caused by the influenza virus. This virus is common every year in the fall and winter. It spreads when virus particles get passed from person to person. You can get the virus if you are near a sick person who is coughing or sneezing. You can also get the virus if you touch  something that has the virus on it and then touch your face. How can I protect myself against the flu?  Get a flu shot. The best way to prevent the flu is to get a flu shot before flu season starts. The flu shot is not dangerous for your developing baby. It may even help protect your baby from the flu for up to 6 months after birth. The flu shot is one type of flu vaccine. Another type is a nasal spray vaccine. Do not get the nasal spray vaccine. It is not approved for pregnancy.  Do not come in close contact with sick people.  Do not share food, drinks, or utensils with other people.  Wash your hands often. Use hand sanitizer when soap and water are not available. What should I do if I have flu symptoms? If you have any flu symptoms, call your health care provider right away. Flu symptoms include:  Fever or chills.  Muscle aches.  Headache.  Sore throat.  Nasal congestion.  Cough.  Feeling tired.  Loss of appetite.  Vomiting.  Diarrhea.  You may be able to take an antiviral medicine to keep the flu from becoming severe and to shorten how long it lasts. What should I do at home if I am diagnosed with the flu?  Do not take any medicine, including cold or flu medicine, unless directed by your health care provider.  If you take  antiviral medicine, make sure you finish it even if you start to feel better.  Drink enough fluid to keep your urine clear or pale yellow.  Get plenty of rest. When would I seek immediate medical care if I have the flu?  You have trouble breathing.  You have chest pain.  You begin to have labor pains.  You have a high fever that does not go down after you take medicine.  You do not feel your baby move.  You have diarrhea or vomiting that will not go away. This information is not intended to replace advice given to you by your health care provider. Make sure you discuss any questions you have with your health care provider. Document Released: 12/12/2007 Document Revised: 07/17/2015 Document Reviewed: 01/05/2013 Elsevier Interactive Patient Education  2017 [employer].

## 2017-12-06 NOTE — Progress Notes (Signed)
   PRENATAL VISIT NOTE  Subjective:  Bonnie Cross is a 27 y.o. G3P2002 at [redacted]w[redacted]d being seen today for ongoing prenatal care.  She is currently monitored for the following issues for this low-risk pregnancy and has Spotting affecting pregnancy; Cramping affecting pregnancy, antepartum; Supervision of other normal pregnancy, antepartum; Uterine size date discrepancy, antepartum; Breast symptom in antepartum period; Pruritic condition; and Anemia complicating pregnancy, third trimester on their problem list.  Patient reports fatigue, occasional HA and tachycardia, itching on palms and soles of feet at night. No rash. .  Contractions: Irregular. Vag. Bleeding: None.  Movement: Present. Denies leaking of fluid.   The following portions of the patient's history were reviewed and updated as appropriate: allergies, current medications, past family history, past medical history, past social history, past surgical history and problem list. Problem list updated.  Objective:   Vitals:   12/06/17 0851  BP: 107/66  Pulse: (!) 101  Weight: 165 lb 12.8 oz (75.2 kg)    Fetal Status: Fetal Heart Rate (bpm): 141   Movement: Present    Fundal Ht 33 cm  General:  Alert, oriented and cooperative. Patient is in no acute distress.  Skin: Skin is warm and dry. No rash noted.   Cardiovascular: Normal heart rate noted  Respiratory: Normal respiratory effort, no problems with respiration noted  Abdomen: Soft, gravid, appropriate for gestational age.  Pain/Pressure: Present     Pelvic: Cervical exam deferred        Extremities: Normal range of motion.  Edema: Trace  Mental Status: Normal mood and affect. Normal behavior. Normal judgment and thought content.   Assessment and Plan:  Pregnancy: G3P2002 at [redacted]w[redacted]d  1. Supervision of other normal pregnancy, antepartum   2. Pruritic condition - Benadryl PRN - Bile acids, total - AST - ALT  3. Anemia complicating pregnancy, third trimester--symptomatic -  Discussed increasing PO iron to TID (unlikely to significantly increase H&H before delivery) vs Feraheme. Pt will think about it and discuss with husband and let provider know her decision at [place].    Tdap and Influenza vaccine: 12/06/17 - Patient request to wait for next ob visit Preterm labor symptoms and general obstetric precautions including but not limited to vaginal bleeding, contractions, leaking of fluid and fetal movement were reviewed in detail with the patient. Please refer to After Visit Summary for other counseling recommendations.  Return in about 2 weeks (around 12/20/2017).  Future Appointments  Date Time Provider Department Center  12/20/2017  8:45 AM Aviva Signs, CNM [facility] None    Dorathy Kinsman, [place]

## 2017-12-08 LAB — BILE ACIDS, TOTAL: BILE ACIDS TOTAL: 2.5 umol/L (ref 0.0–10.0)

## 2017-12-08 LAB — AST: AST: 13 IU/L (ref 0–40)

## 2017-12-08 LAB — ALT: ALT: 8 IU/L (ref 0–32)

## 2017-12-12 ENCOUNTER — Telehealth: Payer: Self-pay

## 2017-12-12 NOTE — Telephone Encounter (Signed)
-----   Message from [place], [place] sent at 12/11/2017  6:58 PM EDT ----- Inform pt of negative testing for Cholestasis of Pregnancy.

## 2017-12-12 NOTE — Telephone Encounter (Signed)
Patient  Called and made aware that negative testing for cholestasis.   Asked patient about her email and she is no longer having symptoms. Patient will return to clinic next week for next OB appt. Armandina Stammer RN

## 2017-12-20 ENCOUNTER — Other Ambulatory Visit: Payer: Self-pay | Admitting: Advanced Practice Midwife

## 2017-12-20 ENCOUNTER — Encounter: Payer: Self-pay | Admitting: Advanced Practice Midwife

## 2017-12-20 ENCOUNTER — Other Ambulatory Visit: Payer: Self-pay | Admitting: Obstetrics and Gynecology

## 2017-12-20 ENCOUNTER — Ambulatory Visit (INDEPENDENT_AMBULATORY_CARE_PROVIDER_SITE_OTHER): Payer: Medicaid Other | Admitting: Advanced Practice Midwife

## 2017-12-20 VITALS — BP 102/63 | HR 103 | Wt 166.0 lb

## 2017-12-20 DIAGNOSIS — L299 Pruritus, unspecified: Secondary | ICD-10-CM

## 2017-12-20 DIAGNOSIS — Z3480 Encounter for supervision of other normal pregnancy, unspecified trimester: Secondary | ICD-10-CM

## 2017-12-20 MED ORDER — SODIUM CHLORIDE 0.9 % IV SOLN: 510.0000 mg | Freq: Once | INTRAVENOUS | 1 refills | Status: AC

## 2017-12-20 NOTE — Patient Instructions (Signed)

## 2017-12-20 NOTE — Progress Notes (Signed)
   PRENATAL VISIT NOTE  Subjective:  Bonnie Cross is a 27 y.o. G3P2002 at [redacted]w[redacted]d being seen today for ongoing prenatal care.  She is currently monitored for the following issues for this low-risk pregnancy and has Spotting affecting pregnancy; Cramping affecting pregnancy, antepartum; Supervision of other normal pregnancy, antepartum; Uterine size date discrepancy, antepartum; Breast symptom in antepartum period; Pruritic condition; and Anemia complicating pregnancy, third trimester on their problem list.  Patient reports no complaints.  Contractions: Irregular. Vag. Bleeding: None.  Movement: Present. Denies leaking of fluid.   The following portions of the patient's history were reviewed and updated as appropriate: allergies, current medications, past family history, past medical history, past social history, past surgical history and problem list. Problem list updated.  Objective:   Vitals:   12/20/17 0851  BP: 102/63  Pulse: (!) 103  Weight: 75.3 kg    Fetal Status: Fetal Heart Rate (bpm): 143 Fundal Height: 33 cm Movement: Present     General:  Alert, oriented and cooperative. Patient is in no acute distress.  Skin: Skin is warm and dry. No rash noted.   Cardiovascular: Normal heart rate noted  Respiratory: Normal respiratory effort, no problems with respiration noted  Abdomen: Soft, gravid, appropriate for gestational age.  Pain/Pressure: Present     Pelvic: Cervical exam deferred        Extremities: Normal range of motion.  Edema: Trace  Mental Status: Normal mood and affect. Normal behavior. Normal judgment and thought content.   Assessment and Plan:  Pregnancy: [id number] at [redacted]w[redacted]d Patient Active Problem List   Diagnosis Date Noted  . Pruritic condition 12/06/2017  . Anemia complicating pregnancy, third trimester 12/06/2017  . Uterine size date discrepancy, antepartum 10/12/2017  . Breast symptom in antepartum period 10/12/2017  . Spotting affecting pregnancy 06/16/2017    . Cramping affecting pregnancy, antepartum 06/16/2017  . Supervision of other normal pregnancy, antepartum 11/01/2011   Discussed workup for cholestasis is negative Itching could be allergy related. Could use Claritin in am and Benadryl at night  Preterm labor symptoms and general obstetric precautions including but not limited to vaginal bleeding, contractions, leaking of fluid and fetal movement were reviewed in detail with the patient. Please refer to After Visit Summary for other counseling recommendations.  No follow-ups on file.  Future Appointments  Date Time Provider Department Center  01/03/2018  9:00 AM Aviva Signs, CNM [facility] None    Wynelle Bourgeois, CNM

## 2017-12-22 ENCOUNTER — Other Ambulatory Visit: Payer: Self-pay | Admitting: Family Medicine

## 2017-12-22 ENCOUNTER — Telehealth: Payer: Self-pay

## 2017-12-22 NOTE — Telephone Encounter (Signed)
Patient scheduled for Ferheme infusion (x2) on 12/30/17 at 9 am.  Patient given address and directions for when she arrives. Armandina Stammer RN

## 2017-12-29 ENCOUNTER — Other Ambulatory Visit (HOSPITAL_COMMUNITY): Payer: Self-pay | Admitting: *Deleted

## 2017-12-30 ENCOUNTER — Ambulatory Visit (HOSPITAL_COMMUNITY)
Admission: RE | Admit: 2017-12-30 | Discharge: 2017-12-30 | Disposition: A | Discharge status: Home / Self Care | Payer: Medicaid Other | Source: Ambulatory Visit | Attending: Family Medicine | Admitting: Family Medicine

## 2017-12-30 ENCOUNTER — Telehealth: Payer: Self-pay

## 2017-12-30 ENCOUNTER — Other Ambulatory Visit: Payer: Self-pay | Admitting: Family Medicine

## 2017-12-30 DIAGNOSIS — O99013 Anemia complicating pregnancy, third trimester: Secondary | ICD-10-CM | POA: Diagnosis not present

## 2017-12-30 DIAGNOSIS — Z3A34 34 weeks gestation of pregnancy: Secondary | ICD-10-CM | POA: Insufficient documentation

## 2017-12-30 MED ORDER — DIPHENHYDRAMINE HCL 50 MG/ML IJ SOLN
INTRAMUSCULAR | Status: AC
  Filled 2017-12-30: qty 1

## 2017-12-30 MED ORDER — SODIUM CHLORIDE 0.9 % IV SOLN
510.0000 mg | INTRAVENOUS | Status: DC
  Administered 2017-12-30: 510 mg via INTRAVENOUS
  Filled 2017-12-30: qty 17

## 2017-12-30 MED ORDER — DIPHENHYDRAMINE HCL 50 MG/ML IJ SOLN
50.0000 mg | Freq: Once | INTRAMUSCULAR | Status: AC
  Administered 2017-12-30: 50 mg via INTRAVENOUS

## 2017-12-30 NOTE — Discharge Instructions (Signed)
Ferric carboxymaltose injection What is this medicine? FERRIC CARBOXYMALTOSE (ferr-ik car-box-ee-mol-toes) is an iron complex. Iron is used to make healthy red blood cells, which carry oxygen and nutrients throughout the body. This medicine is used to treat anemia in people with chronic kidney disease or people who cannot take iron by mouth. This medicine may be used for other purposes; ask your health care provider or pharmacist if you have questions. COMMON BRAND NAME(S): Injectafer What should I tell my health care provider before I take this medicine? They need to know if you have any of these conditions: -anemia not caused by low iron levels -high levels of iron in the blood -liver disease -an unusual or allergic reaction to iron, other medicines, foods, dyes, or preservatives -pregnant or trying to get pregnant -breast-feeding How should I use this medicine? This medicine is for infusion into a vein. It is given by a health care professional in a hospital or clinic setting. Talk to your pediatrician regarding the use of this medicine in children. Special care may be needed. Overdosage: If you think you have taken too much of this medicine contact a poison control center or emergency room at once. NOTE: This medicine is only for you. Do not share this medicine with others. What if I miss a dose? It is important not to miss your dose. Call your doctor or health care professional if you are unable to keep an appointment. What may interact with this medicine? Do not take this medicine with any of the following medications: -deferoxamine -dimercaprol -other iron products This medicine may also interact with the following medications: -chloramphenicol -deferasirox This list may not describe all possible interactions. Give your health care provider a list of all the medicines, herbs, non-prescription drugs, or dietary supplements you use. Also tell them if you smoke, drink alcohol, or  use illegal drugs. Some items may interact with your medicine. What should I watch for while using this medicine? Visit your doctor or health care professional regularly. Tell your doctor if your symptoms do not start to get better or if they get worse. You may need blood work done while you are taking this medicine. You may need to follow a special diet. Talk to your doctor. Foods that contain iron include: whole grains/cereals, dried fruits, beans, or peas, leafy green vegetables, and organ meats (liver, kidney). What side effects may I notice from receiving this medicine? Side effects that you should report to your doctor or health care professional as soon as possible: -allergic reactions like skin rash, itching or hives, swelling of the face, lips, or tongue -breathing problems -changes in blood pressure -feeling faint or lightheaded, falls -flushing, sweating, or hot feelings Side effects that usually do not require medical attention (report to your doctor or health care professional if they continue or are bothersome): -changes in taste -constipation -dizziness -headache -nausea -pain, redness, or irritation at site where injected -vomiting This list may not describe all possible side effects. Call your doctor for medical advice about side effects. You may report side effects to FDA at 1-800-FDA-1088. Where should I keep my medicine? This drug is given in a hospital or clinic and will not be stored at home. NOTE: This sheet is a summary. It may not cover all possible information. If you have questions about this medicine, talk to your doctor, pharmacist, or health care provider.  2018 Elsevier/Gold Standard (2015-03-13 11:20:47) Ferumoxytol injection What is this medicine? FERUMOXYTOL is an iron complex. Iron is used  to make healthy red blood cells, which carry oxygen and nutrients throughout the body. This medicine is used to treat iron deficiency anemia in people with chronic  kidney disease. This medicine may be used for other purposes; ask your health care provider or pharmacist if you have questions. COMMON BRAND NAME(S): Feraheme What should I tell my health care provider before I take this medicine? They need to know if you have any of these conditions: -anemia not caused by low iron levels -high levels of iron in the blood -magnetic resonance imaging (MRI) test scheduled -an unusual or allergic reaction to iron, other medicines, foods, dyes, or preservatives -pregnant or trying to get pregnant -breast-feeding How should I use this medicine? This medicine is for injection into a vein. It is given by a health care professional in a hospital or clinic setting. Talk to your pediatrician regarding the use of this medicine in children. Special care may be needed. Overdosage: If you think you have taken too much of this medicine contact a poison control center or emergency room at once. NOTE: This medicine is only for you. Do not share this medicine with others. What if I miss a dose? It is important not to miss your dose. Call your doctor or health care professional if you are unable to keep an appointment. What may interact with this medicine? This medicine may interact with the following medications: -other iron products This list may not describe all possible interactions. Give your health care provider a list of all the medicines, herbs, non-prescription drugs, or dietary supplements you use. Also tell them if you smoke, drink alcohol, or use illegal drugs. Some items may interact with your medicine. What should I watch for while using this medicine? Visit your doctor or healthcare professional regularly. Tell your doctor or healthcare professional if your symptoms do not start to get better or if they get worse. You may need blood work done while you are taking this medicine. You may need to follow a special diet. Talk to your doctor. Foods that contain iron  include: whole grains/cereals, dried fruits, beans, or peas, leafy green vegetables, and organ meats (liver, kidney). What side effects may I notice from receiving this medicine? Side effects that you should report to your doctor or health care professional as soon as possible: -allergic reactions like skin rash, itching or hives, swelling of the face, lips, or tongue -breathing problems -changes in blood pressure -feeling faint or lightheaded, falls -fever or chills -flushing, sweating, or hot feelings -swelling of the ankles or feet Side effects that usually do not require medical attention (report to your doctor or health care professional if they continue or are bothersome): -diarrhea -headache -nausea, vomiting -stomach pain This list may not describe all possible side effects. Call your doctor for medical advice about side effects. You may report side effects to FDA at 1-800-FDA-1088. Where should I keep my medicine? This drug is given in a hospital or clinic and will not be stored at home. NOTE: This sheet is a summary. It may not cover all possible information. If you have questions about this medicine, talk to your doctor, pharmacist, or health care provider.  2018 Elsevier/Gold Standard (2015-03-13 12:41:49)

## 2017-12-30 NOTE — Progress Notes (Signed)
Pt leaving [facility] now without complaint. VSS. Fetal heart beat 150

## 2017-12-30 NOTE — Telephone Encounter (Signed)
[facility] called stating that the patient was there for her first iron infusion (fereheme). Patient was complaining of difficulty breathing and cramping. IV Fereheme stopped and given benadryl.    Per Dr. Adrian Blackwater change patient's order. He will place orders for the patient. Armandina Stammer RN

## 2017-12-30 NOTE — Progress Notes (Signed)
Pt in [facility] for her first IV Feraheme infusion.  Infusion started and 3 minutes into infusion pt put on call bell and stated that he was having a hard time getting her breath, feeling anxious and states that she felt she was cramping a little. Face flushed. Infusion immediately stopped and NS hung. VS taken and stable. O2 sat 95 at that time. Elsie Lincoln MD called and order received and given for Benadryl. Pt prior to giving benadryl stated she was starting to feel better already. Pt currently resting comfortably. O2 sat back to 100% . Reported this to  Dr Denyce Robert nurse and she states she will let him know.

## 2018-01-03 ENCOUNTER — Other Ambulatory Visit (HOSPITAL_COMMUNITY)
Admission: RE | Admit: 2018-01-03 | Discharge: 2018-01-03 | Disposition: A | Discharge status: Home / Self Care | Payer: Medicaid Other | Source: Ambulatory Visit | Attending: Advanced Practice Midwife | Admitting: Advanced Practice Midwife

## 2018-01-03 ENCOUNTER — Encounter: Payer: Self-pay | Admitting: Advanced Practice Midwife

## 2018-01-03 ENCOUNTER — Ambulatory Visit (INDEPENDENT_AMBULATORY_CARE_PROVIDER_SITE_OTHER): Payer: Medicaid Other | Admitting: Advanced Practice Midwife

## 2018-01-03 ENCOUNTER — Encounter: Payer: Medicaid Other | Admitting: Advanced Practice Midwife

## 2018-01-03 VITALS — BP 105/68 | HR 94 | Wt 166.0 lb

## 2018-01-03 DIAGNOSIS — Z3480 Encounter for supervision of other normal pregnancy, unspecified trimester: Secondary | ICD-10-CM | POA: Insufficient documentation

## 2018-01-03 DIAGNOSIS — Z3483 Encounter for supervision of other normal pregnancy, third trimester: Secondary | ICD-10-CM

## 2018-01-03 DIAGNOSIS — O99013 Anemia complicating pregnancy, third trimester: Secondary | ICD-10-CM

## 2018-01-03 DIAGNOSIS — L299 Pruritus, unspecified: Secondary | ICD-10-CM

## 2018-01-03 LAB — OB RESULTS CONSOLE GC/CHLAMYDIA: GC PROBE AMP, GENITAL: NEGATIVE

## 2018-01-03 LAB — OB RESULTS CONSOLE GBS: STREP GROUP B AG: NEGATIVE

## 2018-01-03 NOTE — Progress Notes (Signed)
   PRENATAL VISIT NOTE  Subjective:  Bonnie Cross is a 27 y.o. G3P2002 at 2539w4d being seen today for ongoing prenatal care.  She is currently monitored for the following issues for this low-risk pregnancy and has Spotting affecting pregnancy; Cramping affecting pregnancy, antepartum; Supervision of other normal pregnancy, antepartum; Uterine size date discrepancy, antepartum; Breast symptom in antepartum period; Pruritic condition; and Anemia complicating pregnancy, third trimester on their problem list.  Patient reports Itching persistent.  Contractions: Irritability. Vag. Bleeding: None.  Movement: Present. Denies leaking of fluid.   The following portions of the patient's history were reviewed and updated as appropriate: allergies, current medications, past family history, past medical history, past social history, past surgical history and problem list. Problem list updated.  Objective:   Vitals:   01/03/18 0905  BP: 105/68  Pulse: 94  Weight: 75.3 kg    Fetal Status: Fetal Heart Rate (bpm): 155 Fundal Height: 36 cm Movement: Present  Presentation: Vertex  General:  Alert, oriented and cooperative. Patient is in no acute distress.  Skin: Skin is warm and dry. No rash noted.   Cardiovascular: Normal heart rate noted  Respiratory: Normal respiratory effort, no problems with respiration noted  Abdomen: Soft, gravid, appropriate for gestational age.  Pain/Pressure: Present     Pelvic: Cervical exam performed Dilation: 1.5 Effacement (%): 60 Station: -3  Extremities: Normal range of motion.  Edema: Trace  Mental Status: Normal mood and affect. Normal behavior. Normal judgment and thought content.   Assessment and Plan:  Pregnancy: G3P2002 at 1339w4d  1. Supervision of other normal pregnancy, antepartum      Labor precautions - Culture, beta strep (group b only) - [facility]lamydia [facility]  2. Pruritic condition     Wants to repeat Bile acids today - Bile  acids, total  3. Anemia complicating pregnancy, third trimester     Did not tolerate Fereheme, does not want to try again     Will take FeSO4 bid with OJ, adusted diet to iron rich foods  Term labor symptoms and general obstetric precautions including but not limited to vaginal bleeding, contractions, leaking of fluid and fetal movement were reviewed in detail with the patient. Please refer to After Visit Summary for other counseling recommendations.  [facility]around [facility]2018[facility]  Future Appointments  Date Time Provider Department Center  01/10/2018  8:45 AM Aviva SignsWilliams, Farida Mcreynolds L, CNM [facility] None    Wynelle BourgeoisMarie Kelsea Mousel, CNM

## 2018-01-03 NOTE — Patient Instructions (Signed)
Vaginal Delivery Vaginal delivery means that you will give birth by pushing your baby out of your birth canal (vagina). A team of health care providers will help you before, during, and after vaginal delivery. Birth experiences are unique for every woman and every pregnancy, and birth experiences vary depending on where you choose to give birth. What should I do to prepare for my baby's birth? Before your baby is born, it is important to talk with your health care provider about:  Your labor and delivery preferences. These may include: ? Medicines that you may be given. ? How you will manage your pain. This might include non-medical pain relief techniques or injectable pain relief such as epidural analgesia. ? How you and your baby will be monitored during labor and delivery. ? Who may be in the labor and delivery room with you. ? Your feelings about surgical delivery of your baby (cesarean delivery, or C-section) if this becomes necessary. ? Your feelings about receiving donated blood through an IV tube (blood transfusion) if this becomes necessary.  Whether you are able: ? To take pictures or videos of the birth. ? To eat during labor and delivery. ? To move around, walk, or change positions during labor and delivery.  What to expect after your baby is born, such as: ? Whether delayed umbilical cord clamping and cutting is offered. ? Who will care for your baby right after birth. ? Medicines or tests that may be recommended for your baby. ? Whether breastfeeding is supported in your hospital or birth center. ? How long you will be in the hospital or birth center.  How any medical conditions you have may affect your baby or your labor and delivery experience.  To prepare for your baby's birth, you should also:  Attend all of your health care visits before delivery (prenatal visits) as recommended by your health care provider. This is important.  Prepare your home for your baby's  arrival. Make sure that you have: ? Diapers. ? Baby clothing. ? Feeding equipment. ? Safe sleeping arrangements for you and your baby.  Install a car seat in your vehicle. Have your car seat checked by a certified car seat installer to make sure that it is installed safely.  Think about who will help you with your new baby at home for at least the first several weeks after delivery.  What can I expect when I arrive at the birth center or hospital? Once you are in labor and have been admitted into the hospital or birth center, your health care provider may:  Review your pregnancy history and any concerns you have.  Insert an IV tube into one of your veins. This is used to give you fluids and medicines.  Check your blood pressure, pulse, temperature, and heart rate (vital signs).  Check whether your bag of water (amniotic sac) has broken (ruptured).  Talk with you about your birth plan and discuss pain control options.  Monitoring Your health care provider may monitor your contractions (uterine monitoring) and your baby's heart rate (fetal monitoring). You may need to be monitored:  Often, but not continuously (intermittently).  All the time or for long periods at a time (continuously). Continuous monitoring may be needed if: ? You are taking certain medicines, such as medicine to relieve pain or make your contractions stronger. ? You have pregnancy or labor complications.  Monitoring may be done by:  Placing a special stethoscope or a handheld monitoring device on your abdomen to   check your baby's heartbeat, and feeling your abdomen for contractions. This method of monitoring does not continuously record your baby's heartbeat or your contractions.  Placing monitors on your abdomen (external monitors) to record your baby's heartbeat and the frequency and length of contractions. You may not have to wear external monitors all the time.  Placing monitors inside of your uterus  (internal monitors) to record your baby's heartbeat and the frequency, length, and strength of your contractions. ? Your health care provider may use internal monitors if he or she needs more information about the strength of your contractions or your baby's heart rate. ? Internal monitors are put in place by passing a thin, flexible wire through your vagina and into your uterus. Depending on the type of monitor, it may remain in your uterus or on your baby's head until birth. ? Your health care provider will discuss the benefits and risks of internal monitoring with you and will ask for your permission before inserting the monitors.  Telemetry. This is a type of continuous monitoring that can be done with external or internal monitors. Instead of having to stay in bed, you are able to move around during telemetry. Ask your health care provider if telemetry is an option for you.  Physical exam Your health care provider may perform a physical exam. This may include:  Checking whether your baby is positioned: ? With the head toward your vagina (head-down). This is most common. ? With the head toward the top of your uterus (head-up or breech). If your baby is in a breech position, your health care provider may try to turn your baby to a head-down position so you can deliver vaginally. If it does not seem that your baby can be born vaginally, your provider may recommend surgery to deliver your baby. In rare cases, you may be able to deliver vaginally if your baby is head-up (breech delivery). ? Lying sideways (transverse). Babies that are lying sideways cannot be delivered vaginally.  Checking your cervix to determine: ? Whether it is thinning out (effacing). ? Whether it is opening up (dilating). ? How low your baby has moved into your birth canal.  What are the three stages of labor and delivery?  Normal labor and delivery is divided into the following three stages: Stage 1  Stage 1 is the  longest stage of labor, and it can last for hours or days. Stage 1 includes: ? Early labor. This is when contractions may be irregular, or regular and mild. Generally, early labor contractions are more than 10 minutes apart. ? Active labor. This is when contractions get longer, more regular, more frequent, and more intense. ? The transition phase. This is when contractions happen very close together, are very intense, and may last longer than during any other part of labor.  Contractions generally feel mild, infrequent, and irregular at first. They get stronger, more frequent (about every 2-3 minutes), and more regular as you progress from early labor through active labor and transition.  Many women progress through stage 1 naturally, but you may need help to continue making progress. If this happens, your health care provider may talk with you about: ? Rupturing your amniotic sac if it has not ruptured yet. ? Giving you medicine to help make your contractions stronger and more frequent.  Stage 1 ends when your cervix is completely dilated to 4 inches (10 cm) and completely effaced. This happens at the end of the transition phase. Stage 2  Once   your cervix is completely effaced and dilated to 4 inches (10 cm), you may start to feel an urge to push. It is common for the body to naturally take a rest before feeling the urge to push, especially if you received an epidural or certain other pain medicines. This rest period may last for up to 1-2 hours, depending on your unique labor experience.  During stage 2, contractions are generally less painful, because pushing helps relieve contraction pain. Instead of contraction pain, you may feel stretching and burning pain, especially when the widest part of your baby's head passes through the vaginal opening (crowning).  Your health care provider will closely monitor your pushing progress and your baby's progress through the vagina during stage 2.  Your  health care provider may massage the area of skin between your vaginal opening and anus (perineum) or apply warm compresses to your perineum. This helps it stretch as the baby's head starts to crown, which can help prevent perineal tearing. ? In some cases, an incision may be made in your perineum (episiotomy) to allow the baby to pass through the vaginal opening. An episiotomy helps to make the opening of the vagina larger to allow more room for the baby to fit through.  It is very important to breathe and focus so your health care provider can control the delivery of your baby's head. Your health care provider may have you decrease the intensity of your pushing, to help prevent perineal tearing.  After delivery of your baby's head, the shoulders and the rest of the body generally deliver very quickly and without difficulty.  Once your baby is delivered, the umbilical cord may be cut right away, or this may be delayed for 1-2 minutes, depending on your baby's health. This may vary among health care providers, hospitals, and birth centers.  If you and your baby are healthy enough, your baby may be placed on your chest or abdomen to help maintain the baby's temperature and to help you bond with each other. Some mothers and babies start breastfeeding at this time. Your health care team will dry your baby and help keep your baby warm during this time.  Your baby may need immediate care if he or she: ? Showed signs of distress during labor. ? Has a medical condition. ? Was born too early (prematurely). ? Had a bowel movement before birth (meconium). ? Shows signs of difficulty transitioning from being inside the uterus to being outside of the uterus. If you are planning to breastfeed, your health care team will help you begin a feeding. Stage 3  The third stage of labor starts immediately after the birth of your baby and ends after you deliver the placenta. The placenta is an organ that develops  during pregnancy to provide oxygen and nutrients to your baby in the womb.  Delivering the placenta may require some pushing, and you may have mild contractions. Breastfeeding can stimulate contractions to help you deliver the placenta.  After the placenta is delivered, your uterus should tighten (contract) and become firm. This helps to stop bleeding in your uterus. To help your uterus contract and to control bleeding, your health care provider may: ? Give you medicine by injection, through an IV tube, by mouth, or through your rectum (rectally). ? Massage your abdomen or perform a vaginal exam to remove any blood clots that are left in your uterus. ? Empty your bladder by placing a thin, flexible tube (catheter) into your bladder. ? Encourage   you to breastfeed your baby. After labor is over, you and your baby will be monitored closely to ensure that you are both healthy until you are ready to go home. Your health care team will teach you how to care for yourself and your baby. This information is not intended to replace advice given to you by your health care provider. Make sure you discuss any questions you have with your health care provider. Document Released: 11/18/2007 Document Revised: 08/29/2015 Document Reviewed: 02/23/2015 Elsevier Interactive Patient Education  2018 [employer]. Iron Deficiency Anemia, Adult Iron-deficiency anemia is when you have a low amount of red blood cells or hemoglobin. This happens because you have too little iron in your body. Hemoglobin carries oxygen to parts of the body. Anemia can cause your body to not get enough oxygen. It may or may not cause symptoms. Follow these instructions at home: Medicines  Take over-the-counter and prescription medicines only as told by your doctor. This includes iron pills (supplements) and vitamins.  If you cannot handle taking iron pills by mouth, ask your doctor about getting iron through: ? A vein (intravenously). ? A  shot (injection) into a muscle.  Take iron pills when your stomach is empty. If you cannot handle this, take them with food.  Do not drink milk or take antacids at the same time as your iron pills.  To prevent trouble pooping (constipation), eat fiber or take medicine (stool softener) as told by your doctor. Eating and drinking  Talk with your doctor before changing the foods you eat. He or she may tell you to eat foods that have a lot of iron, such as: ? Liver. ? Lowfat (lean) beef. ? Breads and cereals that have iron added to them (fortified breads and cereals). ? Eggs. ? Dried fruit. ? Dark green, leafy vegetables.  Drink enough fluid to keep your pee (urine) clear or pale yellow.  Eat fresh fruits and vegetables that are high in vitamin C. They help your body to use iron. Foods with a lot of vitamin C include: ? Oranges. ? Peppers. ? Tomatoes. ? Mangoes. General instructions  Return to your normal activities as told by your doctor. Ask your doctor what activities are safe for you.  Keep yourself clean, and keep things clean around you (your surroundings). Anemia can make you get sick more easily.  Keep all follow-up visits as told by your doctor. This is important. Contact a doctor if:  You feel sick to your stomach (nauseous).  You throw up (vomit).  You feel weak.  You are sweating for no clear reason.  You have trouble pooping, such as: ? Pooping (having a bowel movement) less than 3 times a week. ? Straining to poop. ? Having poop that is hard, dry, or larger than normal. ? Feeling full or bloated. ? Pain in the lower belly. ? Not feeling better after pooping. Get help right away if:  You pass out (faint). If this happens, do not drive yourself to the hospital. Call your local emergency services (911 in the [place]  You have chest pain.  You have shortness of breath that: ? Is very bad. ? Gets worse with physical activity.  You have a fast  heartbeat.  You get light-headed when getting up from sitting or lying down. This information is not intended to replace advice given to you by your health care provider. Make sure you discuss any questions you have with your health care provider. Document Released: 03/13/2010 Document  Revised: 10/29/2015 Document Reviewed: 10/29/2015 Elsevier Interactive Patient Education  2017 [employer]

## 2018-01-04 LAB — GC/CHLAMYDIA PROBE AMP (~~LOC~~) NOT AT ARMC
Chlamydia: NEGATIVE
NEISSERIA GONORRHEA: NEGATIVE

## 2018-01-05 LAB — BILE ACIDS, TOTAL: BILE ACIDS TOTAL: 2.5 umol/L (ref 0.0–10.0)

## 2018-01-06 ENCOUNTER — Encounter (HOSPITAL_COMMUNITY): Payer: Medicaid Other

## 2018-01-07 LAB — CULTURE, BETA STREP (GROUP B ONLY): Strep Gp B Culture: NEGATIVE

## 2018-01-10 ENCOUNTER — Ambulatory Visit (INDEPENDENT_AMBULATORY_CARE_PROVIDER_SITE_OTHER): Payer: Medicaid Other | Admitting: Advanced Practice Midwife

## 2018-01-10 ENCOUNTER — Encounter: Payer: Self-pay | Admitting: Advanced Practice Midwife

## 2018-01-10 VITALS — BP 91/67 | HR 115 | Wt 166.0 lb

## 2018-01-10 DIAGNOSIS — Z3480 Encounter for supervision of other normal pregnancy, unspecified trimester: Secondary | ICD-10-CM

## 2018-01-10 DIAGNOSIS — L299 Pruritus, unspecified: Secondary | ICD-10-CM

## 2018-01-10 DIAGNOSIS — R52 Pain, unspecified: Secondary | ICD-10-CM

## 2018-01-10 DIAGNOSIS — Z3483 Encounter for supervision of other normal pregnancy, third trimester: Secondary | ICD-10-CM

## 2018-01-10 NOTE — Progress Notes (Signed)
   PRENATAL VISIT NOTE  Subjective:  Bonnie Cross is a 27 y.o. G3P2002 at 4931w4d being seen today for ongoing prenatal care.  She is currently monitored for the following issues for this low-risk pregnancy and has Spotting affecting pregnancy; Cramping affecting pregnancy, antepartum; Supervision of other normal pregnancy, antepartum; Uterine size date discrepancy, antepartum; Breast symptom in antepartum period; Pruritic condition; and Anemia complicating pregnancy, third trimester on their problem list.  Patient reports occasional contractions.  Contractions: Irregular. Vag. Bleeding: None.  Movement: Present. Denies leaking of fluid.   The following portions of the patient's history were reviewed and updated as appropriate: allergies, current medications, past family history, past medical history, past social history, past surgical history and problem list. Problem list updated.  Objective:   Vitals:   01/10/18 0857  BP: 91/67  Pulse: (!) 115  Weight: 75.3 kg    Fetal Status: Fetal Heart Rate (bpm): 146 Fundal Height: 36 cm Movement: Present     General:  Alert, oriented and cooperative. Patient is in no acute distress.  Skin: Skin is warm and dry. No rash noted.   Cardiovascular: Normal heart rate noted  Respiratory: Normal respiratory effort, no problems with respiration noted  Abdomen: Soft, gravid, appropriate for gestational age.  Pain/Pressure: Present     Pelvic: Cervical exam deferred        Extremities: Normal range of motion.  Edema: Trace  Mental Status: Normal mood and affect. Normal behavior. Normal judgment and thought content.   Assessment and Plan:  Pregnancy: G3P2002 at 1231w4d  1. Supervision of other normal pregnancy, antepartum   Reviewed GBS and cultures are negative   Reviewed where to go for labor check, reviewed postpartum routines  2. Pruritic condition    Bile acids normal  3.. Vitamin D status    Wants Vit D checked but can't do it today.  Used  to supplement "because it is always low and I feel achy all over so I know it is low".  Term labor symptoms and general obstetric precautions including but not limited to vaginal bleeding, contractions, leaking of fluid and fetal movement were reviewed in detail with the patient. Please refer to After Visit Summary for other counseling recommendations.  Return in about 1 week (around 01/17/2018) [facility].  Future Appointments  Date Time Provider Department Center  01/17/2018  9:30 AM Aviva Signs,  L, CNM [id number] None    Wynelle Bourgeois , CNM

## 2018-01-10 NOTE — Patient Instructions (Signed)
Vaginal Delivery Vaginal delivery means that you will give birth by pushing your baby out of your birth canal (vagina). A team of health care providers will help you before, during, and after vaginal delivery. Birth experiences are unique for every woman and every pregnancy, and birth experiences vary depending on where you choose to give birth. What should I do to prepare for my baby's birth? Before your baby is born, it is important to talk with your health care provider about:  Your labor and delivery preferences. These may include: ? Medicines that you may be given. ? How you will manage your pain. This might include non-medical pain relief techniques or injectable pain relief such as epidural analgesia. ? How you and your baby will be monitored during labor and delivery. ? Who may be in the labor and delivery room with you. ? Your feelings about surgical delivery of your baby (cesarean delivery, or C-section) if this becomes necessary. ? Your feelings about receiving donated blood through an IV tube (blood transfusion) if this becomes necessary.  Whether you are able: ? To take pictures or videos of the birth. ? To eat during labor and delivery. ? To move around, walk, or change positions during labor and delivery.  What to expect after your baby is born, such as: ? Whether delayed umbilical cord clamping and cutting is offered. ? Who will care for your baby right after birth. ? Medicines or tests that may be recommended for your baby. ? Whether breastfeeding is supported in your hospital or birth center. ? How long you will be in the hospital or birth center.  How any medical conditions you have may affect your baby or your labor and delivery experience.  To prepare for your baby's birth, you should also:  Attend all of your health care visits before delivery (prenatal visits) as recommended by your health care provider. This is important.  Prepare your home for your baby's  arrival. Make sure that you have: ? Diapers. ? Baby clothing. ? Feeding equipment. ? Safe sleeping arrangements for you and your baby.  Install a car seat in your vehicle. Have your car seat checked by a certified car seat installer to make sure that it is installed safely.  Think about who will help you with your new baby at home for at least the first several weeks after delivery.  What can I expect when I arrive at the birth center or hospital? Once you are in labor and have been admitted into the hospital or birth center, your health care provider may:  Review your pregnancy history and any concerns you have.  Insert an IV tube into one of your veins. This is used to give you fluids and medicines.  Check your blood pressure, pulse, temperature, and heart rate (vital signs).  Check whether your bag of water (amniotic sac) has broken (ruptured).  Talk with you about your birth plan and discuss pain control options.  Monitoring Your health care provider may monitor your contractions (uterine monitoring) and your baby's heart rate (fetal monitoring). You may need to be monitored:  Often, but not continuously (intermittently).  All the time or for long periods at a time (continuously). Continuous monitoring may be needed if: ? You are taking certain medicines, such as medicine to relieve pain or make your contractions stronger. ? You have pregnancy or labor complications.  Monitoring may be done by:  Placing a special stethoscope or a handheld monitoring device on your abdomen to   check your baby's heartbeat, and feeling your abdomen for contractions. This method of monitoring does not continuously record your baby's heartbeat or your contractions.  Placing monitors on your abdomen (external monitors) to record your baby's heartbeat and the frequency and length of contractions. You may not have to wear external monitors all the time.  Placing monitors inside of your uterus  (internal monitors) to record your baby's heartbeat and the frequency, length, and strength of your contractions. ? Your health care provider may use internal monitors if he or she needs more information about the strength of your contractions or your baby's heart rate. ? Internal monitors are put in place by passing a thin, flexible wire through your vagina and into your uterus. Depending on the type of monitor, it may remain in your uterus or on your baby's head until birth. ? Your health care provider will discuss the benefits and risks of internal monitoring with you and will ask for your permission before inserting the monitors.  Telemetry. This is a type of continuous monitoring that can be done with external or internal monitors. Instead of having to stay in bed, you are able to move around during telemetry. Ask your health care provider if telemetry is an option for you.  Physical exam Your health care provider may perform a physical exam. This may include:  Checking whether your baby is positioned: ? With the head toward your vagina (head-down). This is most common. ? With the head toward the top of your uterus (head-up or breech). If your baby is in a breech position, your health care provider may try to turn your baby to a head-down position so you can deliver vaginally. If it does not seem that your baby can be born vaginally, your provider may recommend surgery to deliver your baby. In rare cases, you may be able to deliver vaginally if your baby is head-up (breech delivery). ? Lying sideways (transverse). Babies that are lying sideways cannot be delivered vaginally.  Checking your cervix to determine: ? Whether it is thinning out (effacing). ? Whether it is opening up (dilating). ? How low your baby has moved into your birth canal.  What are the three stages of labor and delivery?  Normal labor and delivery is divided into the following three stages: Stage 1  Stage 1 is the  longest stage of labor, and it can last for hours or days. Stage 1 includes: ? Early labor. This is when contractions may be irregular, or regular and mild. Generally, early labor contractions are more than 10 minutes apart. ? Active labor. This is when contractions get longer, more regular, more frequent, and more intense. ? The transition phase. This is when contractions happen very close together, are very intense, and may last longer than during any other part of labor.  Contractions generally feel mild, infrequent, and irregular at first. They get stronger, more frequent (about every 2-3 minutes), and more regular as you progress from early labor through active labor and transition.  Many women progress through stage 1 naturally, but you may need help to continue making progress. If this happens, your health care provider may talk with you about: ? Rupturing your amniotic sac if it has not ruptured yet. ? Giving you medicine to help make your contractions stronger and more frequent.  Stage 1 ends when your cervix is completely dilated to 4 inches (10 cm) and completely effaced. This happens at the end of the transition phase. Stage 2  Once   your cervix is completely effaced and dilated to 4 inches (10 cm), you may start to feel an urge to push. It is common for the body to naturally take a rest before feeling the urge to push, especially if you received an epidural or certain other pain medicines. This rest period may last for up to 1-2 hours, depending on your unique labor experience.  During stage 2, contractions are generally less painful, because pushing helps relieve contraction pain. Instead of contraction pain, you may feel stretching and burning pain, especially when the widest part of your baby's head passes through the vaginal opening (crowning).  Your health care provider will closely monitor your pushing progress and your baby's progress through the vagina during stage 2.  Your  health care provider may massage the area of skin between your vaginal opening and anus (perineum) or apply warm compresses to your perineum. This helps it stretch as the baby's head starts to crown, which can help prevent perineal tearing. ? In some cases, an incision may be made in your perineum (episiotomy) to allow the baby to pass through the vaginal opening. An episiotomy helps to make the opening of the vagina larger to allow more room for the baby to fit through.  It is very important to breathe and focus so your health care provider can control the delivery of your baby's head. Your health care provider may have you decrease the intensity of your pushing, to help prevent perineal tearing.  After delivery of your baby's head, the shoulders and the rest of the body generally deliver very quickly and without difficulty.  Once your baby is delivered, the umbilical cord may be cut right away, or this may be delayed for 1-2 minutes, depending on your baby's health. This may vary among health care providers, hospitals, and birth centers.  If you and your baby are healthy enough, your baby may be placed on your chest or abdomen to help maintain the baby's temperature and to help you bond with each other. Some mothers and babies start breastfeeding at this time. Your health care team will dry your baby and help keep your baby warm during this time.  Your baby may need immediate care if he or she: ? Showed signs of distress during labor. ? Has a medical condition. ? Was born too early (prematurely). ? Had a bowel movement before birth (meconium). ? Shows signs of difficulty transitioning from being inside the uterus to being outside of the uterus. If you are planning to breastfeed, your health care team will help you begin a feeding. Stage 3  The third stage of labor starts immediately after the birth of your baby and ends after you deliver the placenta. The placenta is an organ that develops  during pregnancy to provide oxygen and nutrients to your baby in the womb.  Delivering the placenta may require some pushing, and you may have mild contractions. Breastfeeding can stimulate contractions to help you deliver the placenta.  After the placenta is delivered, your uterus should tighten (contract) and become firm. This helps to stop bleeding in your uterus. To help your uterus contract and to control bleeding, your health care provider may: ? Give you medicine by injection, through an IV tube, by mouth, or through your rectum (rectally). ? Massage your abdomen or perform a vaginal exam to remove any blood clots that are left in your uterus. ? Empty your bladder by placing a thin, flexible tube (catheter) into your bladder. ? Encourage   you to breastfeed your baby. After labor is over, you and your baby will be monitored closely to ensure that you are both healthy until you are ready to go home. Your health care team will teach you how to care for yourself and your baby. This information is not intended to replace advice given to you by your health care provider. Make sure you discuss any questions you have with your health care provider. Document Released: 11/18/2007 Document Revised: 08/29/2015 Document Reviewed: 02/23/2015 Elsevier Interactive Patient Education  2018 [employer]  

## 2018-01-16 ENCOUNTER — Inpatient Hospital Stay (HOSPITAL_COMMUNITY)
Admission: AD | Admit: 2018-01-16 | Discharge: 2018-01-18 | DRG: 807 | Disposition: A | Discharge status: Home / Self Care | Payer: Medicaid Other | Attending: Obstetrics and Gynecology | Admitting: Obstetrics and Gynecology

## 2018-01-16 ENCOUNTER — Encounter (HOSPITAL_COMMUNITY): Payer: Self-pay | Admitting: *Deleted

## 2018-01-16 ENCOUNTER — Other Ambulatory Visit: Payer: Self-pay

## 2018-01-16 ENCOUNTER — Telehealth: Payer: Self-pay

## 2018-01-16 DIAGNOSIS — Z3A38 38 weeks gestation of pregnancy: Secondary | ICD-10-CM

## 2018-01-16 DIAGNOSIS — O9902 Anemia complicating childbirth: Principal | ICD-10-CM | POA: Diagnosis present

## 2018-01-16 DIAGNOSIS — Z3490 Encounter for supervision of normal pregnancy, unspecified, unspecified trimester: Secondary | ICD-10-CM

## 2018-01-16 DIAGNOSIS — Z3483 Encounter for supervision of other normal pregnancy, third trimester: Secondary | ICD-10-CM | POA: Diagnosis present

## 2018-01-16 DIAGNOSIS — D509 Iron deficiency anemia, unspecified: Secondary | ICD-10-CM | POA: Diagnosis present

## 2018-01-16 LAB — CBC
HCT: 39.3 % (ref 36.0–46.0)
Hemoglobin: 11.6 g/dL — ABNORMAL LOW (ref 12.0–15.0)
MCH: 25.5 pg — AB (ref 26.0–34.0)
MCHC: 29.5 g/dL — ABNORMAL LOW (ref 30.0–36.0)
MCV: 86.4 fL (ref 80.0–100.0)
NRBC: 0 % (ref 0.0–0.2)
PLATELETS: 204 10*3/uL (ref 150–400)
RBC: 4.55 MIL/uL (ref 3.87–5.11)
RDW: 24.6 % — AB (ref 11.5–15.5)
WBC: 6 10*3/uL (ref 4.0–10.5)

## 2018-01-16 LAB — TYPE AND SCREEN
ABO/RH(D): O POS
ANTIBODY SCREEN: NEGATIVE

## 2018-01-16 LAB — ABO/RH: ABO/RH(D): O POS

## 2018-01-16 MED ORDER — TETANUS-DIPHTH-ACELL PERTUSSIS 5-2.5-18.5 LF-MCG/0.5 IM SUSP: 0.5000 mL | Freq: Once | INTRAMUSCULAR | Status: DC

## 2018-01-16 MED ORDER — WITCH HAZEL-GLYCERIN EX PADS: 1.0000 "application " | MEDICATED_PAD | CUTANEOUS | Status: DC | PRN

## 2018-01-16 MED ORDER — FLEET ENEMA 7-19 GM/118ML RE ENEM: 1.0000 | ENEMA | RECTAL | Status: DC | PRN

## 2018-01-16 MED ORDER — ONDANSETRON HCL 4 MG/2ML IJ SOLN: 4.0000 mg | Freq: Four times a day (QID) | INTRAMUSCULAR | Status: DC | PRN

## 2018-01-16 MED ORDER — DIBUCAINE 1 % RE OINT: 1.0000 "application " | TOPICAL_OINTMENT | RECTAL | Status: DC | PRN

## 2018-01-16 MED ORDER — SENNOSIDES-DOCUSATE SODIUM 8.6-50 MG PO TABS
2.0000 | ORAL_TABLET | ORAL | Status: DC
  Administered 2018-01-17 (×2): 2 via ORAL
  Filled 2018-01-16 (×2): qty 2

## 2018-01-16 MED ORDER — TERBUTALINE SULFATE 1 MG/ML IJ SOLN
0.2500 mg | Freq: Once | INTRAMUSCULAR | Status: DC | PRN
  Filled 2018-01-16: qty 1

## 2018-01-16 MED ORDER — OXYTOCIN 40 UNITS IN LACTATED RINGERS INFUSION - SIMPLE MED
2.5000 [IU]/h | INTRAVENOUS | Status: DC
  Filled 2018-01-16: qty 1000

## 2018-01-16 MED ORDER — FENTANYL CITRATE (PF) 100 MCG/2ML IJ SOLN
100.0000 ug | INTRAMUSCULAR | Status: DC | PRN
  Administered 2018-01-16 (×3): 100 ug via INTRAVENOUS
  Filled 2018-01-16 (×3): qty 2

## 2018-01-16 MED ORDER — IBUPROFEN 600 MG PO TABS
600.0000 mg | ORAL_TABLET | Freq: Four times a day (QID) | ORAL | Status: DC
  Administered 2018-01-17 – 2018-01-18 (×7): 600 mg via ORAL
  Filled 2018-01-16 (×7): qty 1

## 2018-01-16 MED ORDER — OXYCODONE-ACETAMINOPHEN 5-325 MG PO TABS
1.0000 | ORAL_TABLET | ORAL | Status: DC | PRN
  Administered 2018-01-16: 1 via ORAL
  Filled 2018-01-16: qty 1

## 2018-01-16 MED ORDER — OXYCODONE-ACETAMINOPHEN 5-325 MG PO TABS: 2.0000 | ORAL_TABLET | ORAL | Status: DC | PRN

## 2018-01-16 MED ORDER — ACETAMINOPHEN 325 MG PO TABS: 650.0000 mg | ORAL_TABLET | ORAL | Status: DC | PRN

## 2018-01-16 MED ORDER — ZOLPIDEM TARTRATE 5 MG PO TABS: 5.0000 mg | ORAL_TABLET | Freq: Every evening | ORAL | Status: DC | PRN

## 2018-01-16 MED ORDER — OXYTOCIN BOLUS FROM INFUSION
500.0000 mL | Freq: Once | INTRAVENOUS | Status: AC
  Administered 2018-01-16: 500 mL via INTRAVENOUS

## 2018-01-16 MED ORDER — LACTATED RINGERS IV SOLN
INTRAVENOUS | Status: DC
  Administered 2018-01-16: 125 mL/h via INTRAVENOUS

## 2018-01-16 MED ORDER — ACETAMINOPHEN 325 MG PO TABS
650.0000 mg | ORAL_TABLET | ORAL | Status: DC | PRN
  Administered 2018-01-17: 650 mg via ORAL
  Filled 2018-01-16: qty 2

## 2018-01-16 MED ORDER — SOD CITRATE-CITRIC ACID 500-334 MG/5ML PO SOLN: 30.0000 mL | ORAL | Status: DC | PRN

## 2018-01-16 MED ORDER — DIPHENHYDRAMINE HCL 25 MG PO CAPS: 25.0000 mg | ORAL_CAPSULE | Freq: Four times a day (QID) | ORAL | Status: DC | PRN

## 2018-01-16 MED ORDER — LIDOCAINE HCL (PF) 1 % IJ SOLN
30.0000 mL | INTRAMUSCULAR | Status: DC | PRN
  Filled 2018-01-16: qty 30

## 2018-01-16 MED ORDER — BENZOCAINE-MENTHOL 20-0.5 % EX AERO
1.0000 "application " | INHALATION_SPRAY | CUTANEOUS | Status: DC | PRN
  Administered 2018-01-18: 1 via TOPICAL
  Filled 2018-01-16 (×2): qty 56

## 2018-01-16 MED ORDER — OXYTOCIN 40 UNITS IN LACTATED RINGERS INFUSION - SIMPLE MED
1.0000 m[IU]/min | INTRAVENOUS | Status: DC
  Administered 2018-01-16: 2 m[IU]/min via INTRAVENOUS

## 2018-01-16 MED ORDER — ONDANSETRON HCL 4 MG PO TABS: 4.0000 mg | ORAL_TABLET | ORAL | Status: DC | PRN

## 2018-01-16 MED ORDER — ONDANSETRON HCL 4 MG/2ML IJ SOLN: 4.0000 mg | INTRAMUSCULAR | Status: DC | PRN

## 2018-01-16 MED ORDER — SIMETHICONE 80 MG PO CHEW: 80.0000 mg | CHEWABLE_TABLET | ORAL | Status: DC | PRN

## 2018-01-16 MED ORDER — LACTATED RINGERS IV SOLN: 500.0000 mL | INTRAVENOUS | Status: DC | PRN

## 2018-01-16 MED ORDER — COCONUT OIL OIL
1.0000 "application " | TOPICAL_OIL | Status: DC | PRN
  Administered 2018-01-18: 1 via TOPICAL
  Filled 2018-01-16: qty 120

## 2018-01-16 NOTE — H&P (Addendum)
LABOR AND DELIVERY ADMISSION HISTORY AND PHYSICAL NOTE  Bonnie Cross is a 27 y.o. female G74P2002 with IUP at [redacted]w[redacted]d by 7 week [place] presenting for SROM at 1030.  She reports positive fetal movement. She denies vaginal bleeding.  Prenatal History/Complications: PNC at [facility] Pregnancy complications:  - uterine size date discrepancy - anemia  Past Medical History: Past Medical History:  Diagnosis Date  . Medical history non-contributory     Past Surgical History: Past Surgical History:  Procedure Laterality Date  . NOSE SURGERY      Obstetrical History: OB History    Gravida  3   Para  2   Term  2   Preterm      AB      Living  2     SAB      TAB      Ectopic      Multiple      Live Births  2           Social History: Social History   Socioeconomic History  . Marital status: Single    Spouse name: Not on file  . Number of children: Not on file  . Years of education: Not on file  . Highest education level: Not on file  Occupational History  . Not on file  Social Needs  . Financial resource strain: Not on file  . Food insecurity:    Worry: Not on file    Inability: Not on file  . Transportation needs:    Medical: Not on file    Non-medical: Not on file  Tobacco Use  . Smoking status: Never Smoker  . Smokeless tobacco: Never Used  Substance and Sexual Activity  . Alcohol use: Never    Frequency: Never  . Drug use: Never  . Sexual activity: Yes    Birth control/protection: None  Lifestyle  . Physical activity:    Days per week: Not on file    Minutes per session: Not on file  . Stress: Not on file  Relationships  . Social connections:    Talks on phone: Not on file    Gets together: Not on file    Attends religious service: Not on file    Active member of club or organization: Not on file    Attends meetings of clubs or organizations: Not on file    Relationship status: Not on file  Other Topics Concern  . Not on file  Social  History Narrative  . Not on file    Family History: Family History  Problem Relation Age of Onset  . Cancer Paternal Uncle        blood CA  . Healthy Mother   . Stroke Father   . Heart disease Father   . Diabetes Neg Hx   . Hearing loss Neg Hx     Allergies: Allergies  Allergen Reactions  . Feraheme [Ferumoxytol] Shortness Of Breath, Anxiety and Other (See Comments)    Medications Prior to Admission  Medication Sig Dispense Refill Last Dose  . acetaminophen (TYLENOL) 325 MG tablet Take 325 mg by mouth every 6 (six) hours as needed for headache.   Taking  . BANOPHEN 25 MG capsule   1 Not Taking  . butalbital-acetaminophen-caffeine (FIORICET WITH CODEINE) 50-325-40-30 MG capsule Take by mouth.   Not Taking  . diphenhydrAMINE (BENADRYL) 25 MG tablet Take 1-2 tablets (25-50 mg total) by mouth every 6 (six) hours as needed for itching. (Patient not taking: Reported on  01/10/2018) 60 tablet 1 Not Taking  . ferrous sulfate 325 (65 FE) MG tablet Take 1 tablet (325 mg total) by mouth daily with breakfast. 30 tablet 0 Taking  . ferrous sulfate 325 (65 FE) MG tablet Take 325 mg by mouth daily with breakfast.   Taking  . folic acid (FOLVITE) 1 MG tablet Take 1 tablet (1 mg total) by mouth daily. (Patient not taking: Reported on 01/10/2018) 90 tablet 3 Not Taking     Review of Systems  All systems reviewed and negative except as stated in HPI  Physical Exam Blood pressure 118/74, pulse (!) 102, temperature 98.1 F (36.7 C), temperature source Oral, resp. rate 16, last menstrual period 04/22/2017. General appearance: alert, oriented, NAD Lungs: normal respiratory effort Heart: regular rate Abdomen: soft, non-tender; gravid, FH appropriate for GA Extremities: No calf swelling or tenderness Presentation: cephalic Fetal monitoring: category I, baseline HR ~135, positive accelerations, no decels Uterine activity: mild contractions ~3 min apart Dilation: 5 Effacement (%): 60 Station:  -2, -3 Exam by:: jolynn  Prenatal labs: ABO, Rh: O/Positive/-- (06/19 1625) Antibody: Negative (06/19 1625) Rubella: 18.30 (06/19 1625) RPR: Non Reactive (09/17 0955)  HBsAg: Negative (06/19 1625)  HIV: Non Reactive (09/17 0955)  GC/Chlamydia: negative GBS:   negative 2-hr WUJ:WJXBJYGTT:normal Genetic screening:  declined Anatomy US: WNL  Prenatal Transfer Tool  Maternal Diabetes: No Genetic Screening: Declined Maternal Ultrasounds/Referrals: Normal Fetal Ultrasounds or other Referrals:  None Maternal Substance Abuse:  No Significant Maternal Medications:  None Significant Maternal Lab Results: Lab values include: Group B Strep negative  No results found for this or any previous visit (from the past 24 hour(s)).  Patient Active Problem List   Diagnosis Date Noted  . Pregnancy 01/16/2018  . Pruritic condition 12/06/2017  . Anemia complicating pregnancy, third trimester 12/06/2017  . Uterine size date discrepancy, antepartum 10/12/2017  . Breast symptom in antepartum period 10/12/2017  . Spotting affecting pregnancy 06/16/2017  . Cramping affecting pregnancy, antepartum 06/16/2017  . Supervision of other normal pregnancy, antepartum 11/01/2011    Assessment: Bonnie Cross is a 27 y.o. G3P2002 at 2859w3d here for SROM today with contractions ~443minutes apart.  #Labor: SVD expectant management  #Pain: No epidural. Patient prefers IV pain relief #FWB: Category I with baseline HR ~135, occasional accelerations, no decels #ID:  GBS neg #MOF: breast #MOC:unsure, undecided- but leading towards POPs #Circ:  NA- female  Jamelle RushingChelsey Anderson DO 01/16/2018, 11:14 AM   CNM attestation:  I have seen and examined this patient; I agree with above documentation in the resident's note.   Bonnie Cross is a 27 y.o. G3P2002 here for SROM @ 1030 this morning. Some ctx. Rec'd Fentanyl @ 1330.  PE: BP 102/68   Pulse 93   Temp 98.3 F (36.8 C) (Oral)   Resp 16   Ht 5\' 3"  (1.6 m)   Wt 77.1  kg   LMP 04/22/2017 (Exact Date)   BMI 30.11 kg/m  Gen: coping well with ctx Resp: normal effort, no distress Abd: gravid  ROS, labs, PMH reviewed  Plan: Admit to [facility] Expectant management Plan to check cx in 1-2 hrs to eval for change; may need Pit prn  Arabella MerlesKimberly D Shaw CNM 01/16/2018, 3:04 PM

## 2018-01-16 NOTE — Progress Notes (Signed)
LABOR PROGRESS NOTE  Lisbeth RenshawBanafsha Dardis is a 27 y.o. G3P2002 at 6286w3d  admitted for SROM  Subjective: Patient states that contractions are becoming more uncomfortable. She is currently eating. Will recheck cervix to monitor progression.  Objective: BP 123/74   Pulse (!) 105   Temp 98.3 F (36.8 C) (Oral)   Resp 16   Ht 5\' 3"  (1.6 m)   Wt 77.1 kg   LMP 04/22/2017 (Exact Date)   BMI 30.11 kg/m  or  Vitals:   01/16/18 1043 01/16/18 1118 01/16/18 1135  BP: 118/74 123/74   Pulse: (!) 102 (!) 105   Resp: 16    Temp: 98.1 F (36.7 C) 98.3 F (36.8 C)   TempSrc: Oral Oral   Weight:   77.1 kg  Height:   5\' 3"  (1.6 m)    Dilation: 6 Effacement (%): 70 Cervical Position: Middle Station: -1 Presentation: Vertex Exam by:: Johnson Controlsnderson FHT: baseline rate 145, moderate varibility, occasional acel, no decel Toco: contractions every 1-3 minutes moderate  Labs: Lab Results  Component Value Date   WBC 6.0 01/16/2018   HGB 11.6 (L) 01/16/2018   HCT 39.3 01/16/2018   MCV 86.4 01/16/2018   PLT 204 01/16/2018    Patient Active Problem List   Diagnosis Date Noted  . Pregnancy 01/16/2018  . Pruritic condition 12/06/2017  . Anemia complicating pregnancy, third trimester 12/06/2017  . Uterine size date discrepancy, antepartum 10/12/2017  . Breast symptom in antepartum period 10/12/2017  . Spotting affecting pregnancy 06/16/2017  . Cramping affecting pregnancy, antepartum 06/16/2017  . Supervision of other normal pregnancy, antepartum 11/01/2011    Assessment / Plan: 27 y.o. G3P2002 at 4386w3d here for SROM and NSVD. Cervix dilated to ~6cm, 70% effaced checked at 1315, frequent contractions and patient feeling pelvic pressure.  Labor: patient is progressing well without intervention. Will continue to monitor without addition of pitocin at this time. Fetal Wellbeing:  Category I Pain Control:  IV fentanyl- given 1330 Anticipated MOD:  SVD    DO 01/16/2018, 1:39 PM

## 2018-01-16 NOTE — MAU Note (Signed)
Started leaking at 1030, clear fluid, little blood.  Some ctxs maybe every 10 min.  Was 2cm when last checked. Denies problems with preg.

## 2018-01-16 NOTE — Anesthesia Pain Management Evaluation Note (Signed)
  CRNA Pain Management Visit Note  Patient: Bonnie Cross, 27 y.o., female  "Hello I am a [facility]. We have an anesthesia team available at all times to provide care throughout the hospital, including epidural management and anesthesia for C-section. I don't know your plan for the delivery whether it a natural birth, water birth, IV sedation, nitrous supplementation, doula or epidural, but we want to meet your pain goals."   1.Was your pain managed to your expectations on prior hospitalizations?   Yes   2.What is your expectation for pain management during this hospitalization?     IV pain meds  3.How can we help you reach that goal? IV pain Meds only  Record the patient's initial score and the patient's pain goal.   Pain: 3  Pain Goal: 5 [facility] wants you to be able to say your pain was always managed very well.  Ulice BoldDavid Adedayo [facility] 01/16/2018

## 2018-01-16 NOTE — Progress Notes (Signed)
Spoke to Devon EnergyKimberly Shaw, CNM, ordered patient be able to eat a light laboring diet now.

## 2018-01-16 NOTE — Progress Notes (Signed)
LABOR PROGRESS NOTE  Lisbeth RenshawBanafsha Dilger is a 27 y.o. G3P2002 at 5437w3d  admitted for SROM  Subjective: Patient states that her contractions are still uncomfortable but the IV pain medication is helping.   Objective: BP 102/68   Pulse 93   Temp 98.3 F (36.8 C) (Oral)   Resp 16   Ht 5\' 3"  (1.6 m)   Wt 77.1 kg   LMP 04/22/2017 (Exact Date)   BMI 30.11 kg/m  or  Vitals:   01/16/18 1118 01/16/18 1135 01/16/18 1323 01/16/18 1334  BP: 123/74  117/70 102/68  Pulse: (!) 105  (!) 110 93  Resp:   16 16  Temp: 98.3 F (36.8 C)     TempSrc: Oral     Weight:  77.1 kg    Height:  5\' 3"  (1.6 m)     Dilation: 8 Effacement: 80 Middle -1 vertex FHT: baseline rate 150, moderate varibility, occasional acel, negative decel Toco: have stalled now  Labs: Lab Results  Component Value Date   WBC 6.0 01/16/2018   HGB 11.6 (L) 01/16/2018   HCT 39.3 01/16/2018   MCV 86.4 01/16/2018   PLT 204 01/16/2018    Patient Active Problem List   Diagnosis Date Noted  . Pregnancy 01/16/2018  . Pruritic condition 12/06/2017  . Anemia complicating pregnancy, third trimester 12/06/2017  . Uterine size date discrepancy, antepartum 10/12/2017  . Breast symptom in antepartum period 10/12/2017  . Spotting affecting pregnancy 06/16/2017  . Cramping affecting pregnancy, antepartum 06/16/2017  . Supervision of other normal pregnancy, antepartum 11/01/2011    Assessment / Plan: 27 y.o. G3P2002 at 6237w3d here for SROM and NSVD. Cervix dilated to ~8cm, 80% effaced - checked about 1640. Patient received dose of fentanyl IV prior to exam which improved pain and made patient sleepy.  Labor: checked cervix and is progressing well. Consider starting pitocin if contractions have stalled Fetal Wellbeing:  Category I Pain Control:  Fentanyl given 1637 Anticipated MOD:  SVD  Lorrie Gargan DO  01/16/2018, 4:58 PM

## 2018-01-16 NOTE — Progress Notes (Signed)
10 instruments 10 sponges 2 sharps Bonnie Cross

## 2018-01-16 NOTE — Progress Notes (Signed)
Patient ID: Bonnie Cross, female   DOB: 09/24/1990, 27 y.o.   MRN: [id number]  In pt's room since 1925. Did side-lying release x both sides, and pt then got in hands and knees, and is now in bathroom voiding. FHR stable 140s, ctx q 3 mins, strong, VSS. Cx not rexamined. Pt not feeling increased rectal pressure yet. Will give hands and knees a bit more time, and then discuss Pit. She has labored spontaneously this far, and I think baby needs to rotate vs needing stronger ctx, but will consider Pit next. Anticipate SVD.  Arabella MerlesKimberly D Shaw CNM 01/16/2018 7:57 PM

## 2018-01-16 NOTE — Telephone Encounter (Signed)
Patient called stating that her water broke just now and wondering what she should do. [facility]. Armandina StammerJennifer Adlai Nieblas RN

## 2018-01-17 ENCOUNTER — Encounter: Payer: Medicaid Other | Admitting: Advanced Practice Midwife

## 2018-01-17 LAB — RPR: RPR: NONREACTIVE

## 2018-01-17 NOTE — Lactation Note (Signed)
This note was copied from a baby's chart. Lactation Consultation Note  Patient Name: [name] JWJXB'JToday's Date: 01/17/2018 Reason for consult: Initial assessment;Early term 37-38.6wks Breastfeeding consultation services and support information given and reviewed.  Baby is 15 hours old.  Mom chose to give 2 bottles of formula initially and then put baby to breast 2 hours ago.  Baby is sleeping skin to skin on mom's chest.  Offered feeding assist but mom would like to wait.  Instructed to feed with cues and call for assist prn.  Mom denies questions or concerns.  Maternal Data Has patient been taught Hand Expression?: Yes Does the patient have breastfeeding experience prior to this delivery?: Yes  Feeding Feeding Type: Breast Fed  LATCH Score                   Interventions    Lactation Tools Discussed/Used     Consult Status Consult Status: Follow-up Date: 01/18/18 Follow-up type: In-patient    Huston FoleyMOULDEN, Mekhi Sonn S 01/17/2018, 1:37 PM

## 2018-01-17 NOTE — Progress Notes (Signed)
POSTPARTUM PROGRESS NOTE  Post Partum Day 1  Subjective:  Bonnie Cross is a 27 y.o. G3P3003 s/p SVD at 3732w3d.  No acute events overnight.  Pt denies problems with ambulating, voiding or po intake.  She denies nausea or vomiting.  Pain is well controlled.  She has not had flatus. She has not had bowel movement.  Lochia Small.   Objective: Blood pressure 100/63, pulse 91, temperature 98.6 F (37 C), temperature source Oral, resp. rate 16, height 5\' 3"  (1.6 m), weight 77.1 kg, last menstrual period 04/22/2017, unknown if currently breastfeeding.  Physical Exam:  General: alert, cooperative and no distress Chest: no respiratory distress Heart:regular rate, distal pulses intact Abdomen: soft, nontender,  Uterine Fundus: firm, appropriately tender DVT Evaluation: No calf swelling or tenderness Extremities: no edema Skin: warm, dry  Recent Labs    01/16/18 1123  HGB 11.6*  HCT 39.3    Assessment/Plan: Bonnie Cross is a 27 y.o. [id number] s/p SVD at 4532w3d   PPD#1 - Doing well Contraception: undecided Feeding: breast Dispo: Plan for discharge tomorrow.   LOS: 1 day   Sharyon CableRogers, Willmer Fellers C, CNM 01/17/2018, 7:18 AM

## 2018-01-18 MED ORDER — IBUPROFEN 600 MG PO TABS: 600.0000 mg | ORAL_TABLET | Freq: Four times a day (QID) | ORAL | 0 refills | Status: DC

## 2018-01-18 MED FILL — IBUPROFEN 600 MG TABLET: 600 | 8 days supply | Qty: 30 | Fill #0

## 2018-01-18 NOTE — Lactation Note (Signed)
This note was copied from a baby's chart. Lactation Consultation Note  Patient Name: Bonnie Lisbeth RenshawBanafsha Wroblewski ZOXWR'UToday's Date: 01/18/2018 Reason for consult: Follow-up assessment;Early term 37-38.6wks   Follow up with Exp BF mom of 35 hour old infant. Infant with 4 BF for 20-30 minutes, formula x 4 via bottle of 15-35 cc, 1 voids and 4 stools in the last 24 hours. Infant weight 6 pounds 15.6 ounces with 4% weight loss since birth. Infant in bed suckling on pacifier. Enc mom to avoid pacifier use for the first 2-3 weeks until BF is well established. Enc mom to meet infant suckling needs at the breast.   Mom reports she BF infant for 10 minutes on each breast and then offers the bottle and she does not have enough milk. Reviewed supply and demand and enc mom not to limit BF time at the breast so to stimulate a full milk supply. Enc mom to offer the breast with feeding cues prior to offering formula. Enc mom to pump and hand express and use her milk to feed infant. Manual pump given with instructions for use and cleaning. Mom requested coconut oil and was given to her with instructions for use.   Reviewed I/O, signs of dehydration in the infant, positioning, hand expression, signs your infant is getting enough, engorgement prevention/treatment, and breast milk expression and storage. [facility] reviewed, mom aware of OP services, BF Support Groups and LC phone #.   Mom reports she does not have any questions/concerns at this time. She reports infant if BF well and she denies nipple pain with feeding. Mom to call out for assistance as needed.    Maternal Data Formula Feeding for Exclusion: Yes Reason for exclusion: Mother's choice to formula and breast feed on admission Has patient been taught Hand Expression?: Yes Does the patient have breastfeeding experience prior to this delivery?: Yes  Feeding    LATCH Score                   Interventions Interventions: Breast feeding basics  reviewed;Support pillows;Assisted with latch;Skin to skin;Breast compression;Breast massage;Expressed milk;Coconut oil;Hand pump;Hand express  Lactation Tools Discussed/Used WIC Program: No Pump Review: Milk Storage;Setup, frequency, and cleaning   Consult Status Consult Status: Complete Follow-up type: Call as needed    Ed BlalockSharon S  01/18/2018, 9:56 AM

## 2018-01-18 NOTE — H&P (Deleted)
Obstetrics Discharge Summary OB/GYN Faculty Practice   Patient Name: Bonnie Cross DOB: 11/14/1990 MRN: [id number]  Date of admission: 01/16/2018 Delivering MD: ROGERS, VERONICA C   Date of discharge: 01/18/2018  Admitting diagnosis: 38wks water broke slight ctx  Intrauterine pregnancy: [redacted]w[redacted]d     Secondary diagnosis:   Active Problems:   Pregnancy   Normal labor   SVD (spontaneous vaginal delivery)  Additional problems:  . Iron deficiency anemia - received IV iron once during pregnancy     Discharge diagnosis: Term Pregnancy Delivered                                Postpartum procedures: None  Complications: none  Outpatient Follow-Up: [ ] birth control counseling - undecided on day of discharge   Hospital course: Analysa Cross is a 27 y.o. [redacted]w[redacted]d who was admitted for spontaneous onset of labor, SROM. Her pregnancy was complicated by iron deficiency anemia requiring IV iron but hemoglobin 11.6. Her labor course was notable for pitocin augmentation. Delivery was complicated by hemostatic 1st degree laceration. Please see delivery/op note for additional details. Her postpartum course was uncomplicated. She was breastfeeding without difficulty and working with lactaation. By day of discharge, she was passing flatus, urinating, eating and drinking without difficulty. She did report two small clots overnight on PPD#1 but bleeding overall had decreased. Her pain was well-controlled, and she was discharged home with ibuprofen. She will follow-up in clinic in 4-6 weeks.   Physical exam  Vitals:   01/17/18 0900 01/17/18 1330 01/17/18 2149 01/18/18 0524  BP: 105/65 111/84 108/77 108/83  Pulse: 79 96 96 73  Resp: 16 18 14 16  Temp: 98.1 F (36.7 C) 98.4 F (36.9 C) 98.3 F (36.8 C) 97.9 F (36.6 C)  TempSrc: Oral Oral Oral Oral  SpO2:   98%   Weight:      Height:       General: well-appearing, NAD Lochia: appropriate Uterine Fundus: firm Incision: N/A DVT Evaluation: No  significant calf/ankle edema. Labs: Lab Results  Component Value Date   WBC 6.0 01/16/2018   HGB 11.6 (L) 01/16/2018   HCT 39.3 01/16/2018   MCV 86.4 01/16/2018   PLT 204 01/16/2018   CMP Latest Ref Rng & Units 12/06/2017  Glucose 70 - 99 mg/dL -  BUN 6 - 20 mg/dL -  Creatinine 0.44 - 1.00 mg/dL -  Sodium 135 - 145 mmol/L -  Potassium 3.5 - 5.1 mmol/L -  Chloride 98 - 111 mmol/L -  CO2 22 - 32 mmol/L -  Calcium 8.9 - 10.3 mg/dL -  Total Protein 6.5 - 8.1 g/dL -  Total Bilirubin 0.3 - 1.2 mg/dL -  Alkaline Phos 38 - 126 U/L -  AST 0 - 40 IU/L 13  ALT 0 - 32 IU/L 8    Discharge instructions: Per After Visit Summary and "Baby and Me Booklet"  After visit meds:  Allergies as of 01/18/2018      Reactions   Feraheme [ferumoxytol] Shortness Of Breath, Anxiety, Other (See Comments)      Medication List    STOP taking these medications   diphenhydrAMINE 25 MG tablet Commonly known as:  BENADRYL     TAKE these medications   ferrous sulfate 325 (65 FE) MG tablet Take 1 tablet (325 mg total) by mouth daily with breakfast. What changed:  Another medication with the same name was removed. Continue taking this medication, and follow   the directions you see here.   folic acid 1 MG tablet Commonly known as:  FOLVITE Take 1 tablet (1 mg total) by mouth daily.   ibuprofen 600 MG tablet Commonly known as:  ADVIL,MOTRIN Take 1 tablet (600 mg total) by mouth every 6 (six) hours.      Postpartum contraception: Undecided - counseled on options Diet: Routine Diet Activity: Advance as tolerated. Pelvic rest for 6 weeks.   Follow-up Appt: Future Appointments  Date Time Provider Department Center  02/20/2018  8:30 AM Harraway-Smith, Carolyn, MD [facility] None   Newborn Data: Live born female  Birth Weight: 7 lb 4.4 oz (3300 g) APGAR: 9, 9  Newborn Delivery   Birth date/time:  01/16/2018 22:04:00 Delivery type:  Vaginal, Spontaneous    Baby Feeding:  Breast Disposition:home with mother  Allegra Cerniglia S. Danyael Alipio, DO OB/GYN Fellow, Faculty Practice      

## 2018-01-18 NOTE — Progress Notes (Signed)
Upon discharge teaching patient stated she had passed two clots last evening. One was bright red and larger than a plum and the other was the same sized but looked like it had tissue attached to it. Informed Dr. Earlene PlaterWallace.

## 2018-01-18 NOTE — Discharge Summary (Signed)
Obstetrics Discharge Summary OB/GYN Faculty Practice   Patient Name: Bonnie Cross DOB: 10/08/1990 MRN: [id number]  Date of admission: 01/16/2018 Delivering MD: Sharyon CableOGERS, VERONICA C   Date of discharge: 01/18/2018  Admitting diagnosis: 38wks water broke slight ctx  Intrauterine pregnancy: 6329w3d     Secondary diagnosis:   Active Problems:   Pregnancy   Normal labor   SVD (spontaneous vaginal delivery)  Additional problems:  . Iron deficiency anemia - received IV iron once during pregnancy     Discharge diagnosis: Term Pregnancy Delivered                                Postpartum procedures: None  Complications: none  Outpatient Follow-Up: [ ]  birth control counseling - undecided on day of discharge   Hospital course: Bonnie Cross is a 27 y.o. 3829w3d who was admitted for spontaneous onset of labor, SROM. Her pregnancy was complicated by iron deficiency anemia requiring IV iron but hemoglobin 11.6. Her labor course was notable for pitocin augmentation. Delivery was complicated by hemostatic 1st degree laceration. Please see delivery/op note for additional details. Her postpartum course was uncomplicated. She was breastfeeding without difficulty and working with Engineer, agriculturallactaation. By day of discharge, she was passing flatus, urinating, eating and drinking without difficulty. She did report two small clots overnight on PPD#1 but bleeding overall had decreased. Her pain was well-controlled, and she was discharged home with ibuprofen. She will follow-up in clinic in 4-6 weeks.   Physical exam  Vitals:   01/17/18 0900 01/17/18 1330 01/17/18 2149 01/18/18 0524  BP: 105/65 111/84 108/77 108/83  Pulse: 79 96 96 73  Resp: 16 18 14 16   Temp: 98.1 F (36.7 C) 98.4 F (36.9 C) 98.3 F (36.8 C) 97.9 F (36.6 C)  TempSrc: Oral Oral Oral Oral  SpO2:   98%   Weight:      Height:       General: well-appearing, NAD Lochia: appropriate Uterine Fundus: firm Incision: N/A DVT Evaluation: No  significant calf/ankle edema. Labs: Lab Results  Component Value Date   WBC 6.0 01/16/2018   HGB 11.6 (L) 01/16/2018   HCT 39.3 01/16/2018   MCV 86.4 01/16/2018   PLT 204 01/16/2018   CMP Latest Ref Rng & Units 12/06/2017  Glucose 70 - 99 mg/dL -  BUN 6 - 20 mg/dL -  Creatinine 0.860.44 - 5.781.00 mg/dL -  Sodium 469135 - 629145 mmol/L -  Potassium 3.5 - 5.1 mmol/L -  Chloride 98 - 111 mmol/L -  CO2 22 - 32 mmol/L -  Calcium 8.9 - 10.3 mg/dL -  Total Protein 6.5 - 8.1 g/dL -  Total Bilirubin 0.3 - 1.2 mg/dL -  Alkaline Phos 38 - 528126 U/L -  AST 0 - 40 IU/L 13  ALT 0 - 32 IU/L 8    Discharge instructions: Per After Visit Summary and "Baby and Me Booklet"  After visit meds:  Allergies as of 01/18/2018      Reactions   Feraheme [ferumoxytol] Shortness Of Breath, Anxiety, Other (See Comments)      Medication List    STOP taking these medications   diphenhydrAMINE 25 MG tablet Commonly known as:  BENADRYL     TAKE these medications   ferrous sulfate 325 (65 FE) MG tablet Take 1 tablet (325 mg total) by mouth daily with breakfast. What changed:  Another medication with the same name was removed. Continue taking this medication, and follow  the directions you see here.   folic acid 1 MG tablet Commonly known as:  FOLVITE Take 1 tablet (1 mg total) by mouth daily.   ibuprofen 600 MG tablet Commonly known as:  ADVIL,MOTRIN Take 1 tablet (600 mg total) by mouth every 6 (six) hours.      Postpartum contraception: Undecided - counseled on options Diet: Routine Diet Activity: Advance as tolerated. Pelvic rest for 6 weeks.   Follow-up Appt: Future Appointments  Date Time Provider Department Center  02/20/2018  8:30 AM Willodean Rosenthal, MD [facility] None   Newborn Data: Live born female  Birth Weight: 7 lb 4.4 oz (3300 g) APGAR: 9, 9  Newborn Delivery   Birth date/time:  01/16/2018 22:04:00 Delivery type:  Vaginal, Spontaneous    Baby Feeding:  Breast Disposition:home with mother  Cristal Deer. Earlene Plater, DO OB/GYN Fellow, Faculty Practice

## 2018-01-26 ENCOUNTER — Telehealth: Payer: Self-pay

## 2018-01-26 NOTE — Telephone Encounter (Signed)
[facility] calling stating she has lump in right breast. Patient states that she is now having pain with lump. Nurse from [facility] wants her to be seen. Patient does not want female provider. Placed her on the schedule with Dr. Erin FullingHarraway Smith on Friday Dec 13th. Armandina StammerJennifer Shalea Tomczak RN

## 2018-02-03 ENCOUNTER — Encounter: Payer: Self-pay | Admitting: Obstetrics & Gynecology

## 2018-02-03 ENCOUNTER — Other Ambulatory Visit: Payer: Self-pay | Admitting: Obstetrics & Gynecology

## 2018-02-03 ENCOUNTER — Ambulatory Visit (INDEPENDENT_AMBULATORY_CARE_PROVIDER_SITE_OTHER): Payer: Medicaid Other | Admitting: Obstetrics & Gynecology

## 2018-02-03 ENCOUNTER — Ambulatory Visit: Payer: Medicaid Other | Admitting: Obstetrics & Gynecology

## 2018-02-03 VITALS — BP 104/74 | HR 60 | Ht 63.0 in | Wt 144.1 lb

## 2018-02-03 DIAGNOSIS — N6311 Unspecified lump in the right breast, upper outer quadrant: Secondary | ICD-10-CM

## 2018-02-03 NOTE — Progress Notes (Signed)
History:  27 y.o. [id number] here today for eval of mass in right breast. Pt reports that the mass was first noted when he was in her 2nd trimester. She reports that it has not changed since she began nursing but, it does hurt when she is nursing. She denies nipple discharge or skin changes.     The following portions of the patient's history were reviewed and updated as appropriate: allergies, current medications, past family history, past medical history, past social history, past surgical history and problem list.  Review of Systems:  Pertinent items are noted in HPI.    Objective:  Physical Exam Blood pressure 104/74, pulse 60, height 5\' 3"  (1.6 m), weight 144 lb 1.3 oz (65.4 kg), unknown if currently breastfeeding.  CONSTITUTIONAL: Well-developed, well-nourished female in no acute distress.  HENT:  Normocephalic, atraumatic EYES: Conjunctivae and EOM are normal. No scleral icterus.  NECK: Normal range of motion SKIN: Skin is warm and dry. No rash noted. Not diaphoretic.No pallor. NEUROLGIC: Alert and oriented to person, place, and time. Normal coordination.  Breasts: bilaterally no skin changes or nipple discharge. The right breat contains a well circumscribed 3 cm mobile mass in the upper outer quadrant.  Nontender to palpation.   Assessment & Plan:  Right breast mass. Suspect cyst.   Right breast US  F/u for routine PP exam as prev  scheduled.    May continue to nurse as prev.   Kasra Melvin L. Harraway-Smith, M.D., Evern CoreFACOG

## 2018-02-03 NOTE — Patient Instructions (Signed)
Breast Cyst A breast cyst is a sac in the breast that is filled with fluid. Breast cysts are usually noncancerous (benign). They are common among women, and they are most often located in the upper, outer portion of the breast. One or more cysts may develop. They form when fluid builds up inside of the breast glands. There are several types of breast cysts:  Macrocyst. This is a cyst that is about 2 inches (5.1 cm) across (in diameter).  Microcyst. This is a very small cyst that you cannot feel, but it can be seen with imaging tests such as an X-ray of the breast (mammogram) or ultrasound.  Galactocele. This is a cyst that contains milk. It may develop if you suddenly stop breastfeeding.  Breast cysts do not increase your risk of breast cancer. They usually disappear after menopause, unless you take artificial hormones (are on hormone therapy). What are the causes? The exact cause of breast cysts is not known. Possible causes include:  Blockage of tubes (ducts) in the breast glands, which leads to fluid buildup. Duct blockage may result from: ? Fibrocystic breast changes. This is a common, benign condition that occurs when women go through hormonal changes during the menstrual cycle. This is a common cause of multiple breast cysts. ? Overgrowth of breast tissue or breast glands. ? Scar tissue in the breast from previous surgery.  Changes in certain female hormones (estrogen and progesterone).  What increases the risk? You may be more likely to develop breast cysts if you have not gone through menopause. What are the signs or symptoms? Symptoms of a breast cyst may include:  Feeling one or more smooth, round, soft lumps (like grapes) in the breast that are easily moveable. The lump(s) may get bigger and more painful before your period and get smaller after your period.  Breast discomfort or pain.  How is this diagnosed? A cyst can be felt during a physical exam by your health care  provider. A mammogram and ultrasound will be done to confirm the diagnosis. Fluid may be removed from the cyst with a needle (fine-needle aspiration) and tested to make sure the cyst is not cancerous. How is this treated? Treatment may not be necessary. Your health care provider may monitor the cyst to see if it goes away on its own. If the cyst is uncomfortable or gets bigger, or if you do not like how the cyst makes your breast look, you may need treatment. Treatment may include:  Hormone treatment.  Fine-needle aspiration, to drain fluid from the cyst. There is a chance of the cyst coming back (recurring) after aspiration.  Surgery to remove the cyst.  Follow these instructions at home:  See your health care provider regularly. ? Get a yearly physical exam. ? If you are 20-40 years old, get a clinical breast exam every 1-3 years. After age 40, get this exam every year. ? Get mammograms as often as directed.  Do a breast self-exam every month, or as often as directed. Having many breast cysts, or "lumpy" breasts, may make it harder to feel for new lumps. Understand how your breasts normally look and feel, and write down any changes in your breasts so you can tell your health care provider about the changes. A breast self-exam involves: ? Comparing your breasts in the mirror. ? Looking for visible changes in your skin or nipples. ? Feeling for lumps or changes.  Take over-the-counter and prescription medicines only as told by your health care   provider.  Wear a supportive bra, especially when exercising.  Follow instructions from your health care provider about eating and drinking restrictions. ? Avoid caffeine. ? Cut down on salt (sodium) in what you eat and drink, especially before your menstrual period. Too much sodium can cause fluid buildup (retention), breast swelling, and discomfort.  Keep all follow-up visits as told your health care provider. This is important. Contact a  health care provider if:  You feel, or think you feel, a lump in your breast.  You notice that both breasts look or feel different than usual.  Your breast is still causing pain after your menstrual period is over.  You find new lumps or bumps that were not there before.  You feel lumps in your armpit (axilla). Get help right away if:  You have severe pain, tenderness, redness, or warmth in your breast.  You have fluid or blood leaking from your nipple.  Your breast lump becomes hard and painful.  You notice dimpling or wrinkling of the breast or nipple. This information is not intended to replace advice given to you by your health care provider. Make sure you discuss any questions you have with your health care provider. Document Released: 02/08/2005 Document Revised: 10/31/2015 Document Reviewed: 10/31/2015 Elsevier Interactive Patient Education  2017 [employer]  

## 2018-02-20 ENCOUNTER — Ambulatory Visit: Payer: Medicaid Other | Admitting: Obstetrics & Gynecology

## 2018-02-23 ENCOUNTER — Other Ambulatory Visit: Payer: Medicaid Other

## 2018-02-28 ENCOUNTER — Ambulatory Visit: Payer: Medicaid Other | Admitting: Advanced Practice Midwife

## 2018-03-03 ENCOUNTER — Ambulatory Visit
Admission: RE | Admit: 2018-03-03 | Discharge: 2018-03-03 | Disposition: A | Discharge status: Home / Self Care | Payer: Medicaid Other | Source: Ambulatory Visit | Attending: Obstetrics & Gynecology | Admitting: Obstetrics & Gynecology

## 2018-03-03 ENCOUNTER — Other Ambulatory Visit: Payer: Self-pay | Admitting: Obstetrics & Gynecology

## 2018-03-03 DIAGNOSIS — N6311 Unspecified lump in the right breast, upper outer quadrant: Secondary | ICD-10-CM

## 2018-03-10 ENCOUNTER — Ambulatory Visit
Admission: RE | Admit: 2018-03-10 | Discharge: 2018-03-10 | Disposition: A | Discharge status: Home / Self Care | Payer: Medicaid Other | Source: Ambulatory Visit | Attending: Obstetrics & Gynecology | Admitting: Obstetrics & Gynecology

## 2018-03-10 DIAGNOSIS — N6311 Unspecified lump in the right breast, upper outer quadrant: Secondary | ICD-10-CM

## 2018-12-22 DIAGNOSIS — G8929 Other chronic pain: Secondary | ICD-10-CM | POA: Insufficient documentation

## 2018-12-22 DIAGNOSIS — M2550 Pain in unspecified joint: Secondary | ICD-10-CM | POA: Insufficient documentation

## 2018-12-22 DIAGNOSIS — E559 Vitamin D deficiency, unspecified: Secondary | ICD-10-CM | POA: Insufficient documentation

## 2019-07-09 IMAGING — CR DG CHEST 2V
2 series · 2 of 2 positions shown · non-contrast
Comparison: None.

CLINICAL DATA: Patient c/o cough x 2 weeks, slight sob, having some
right scapular pains as well, patient was double shielded, no other
complaints

EXAM:
CHEST - 2 VIEW

[w chest pa]
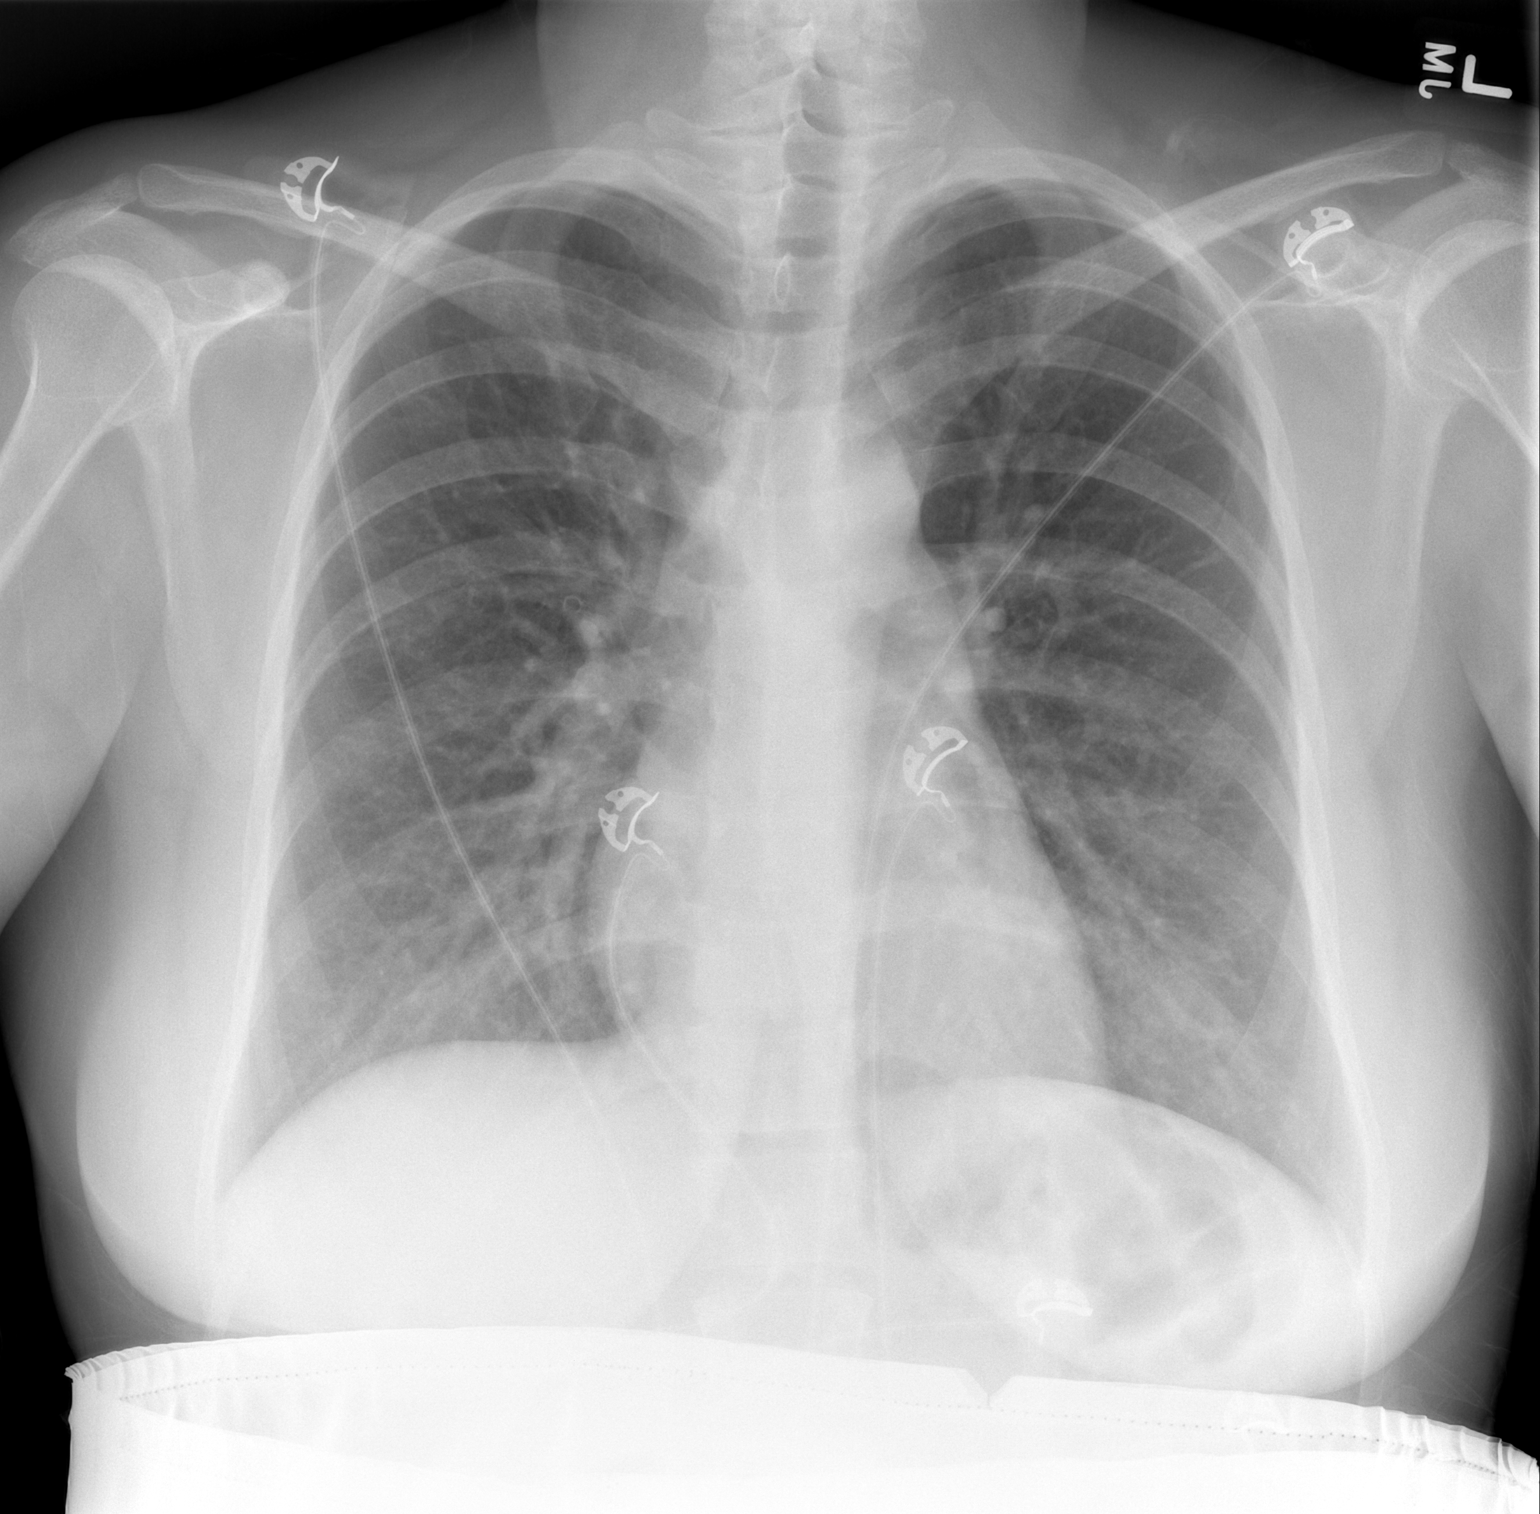

[w chest lat]
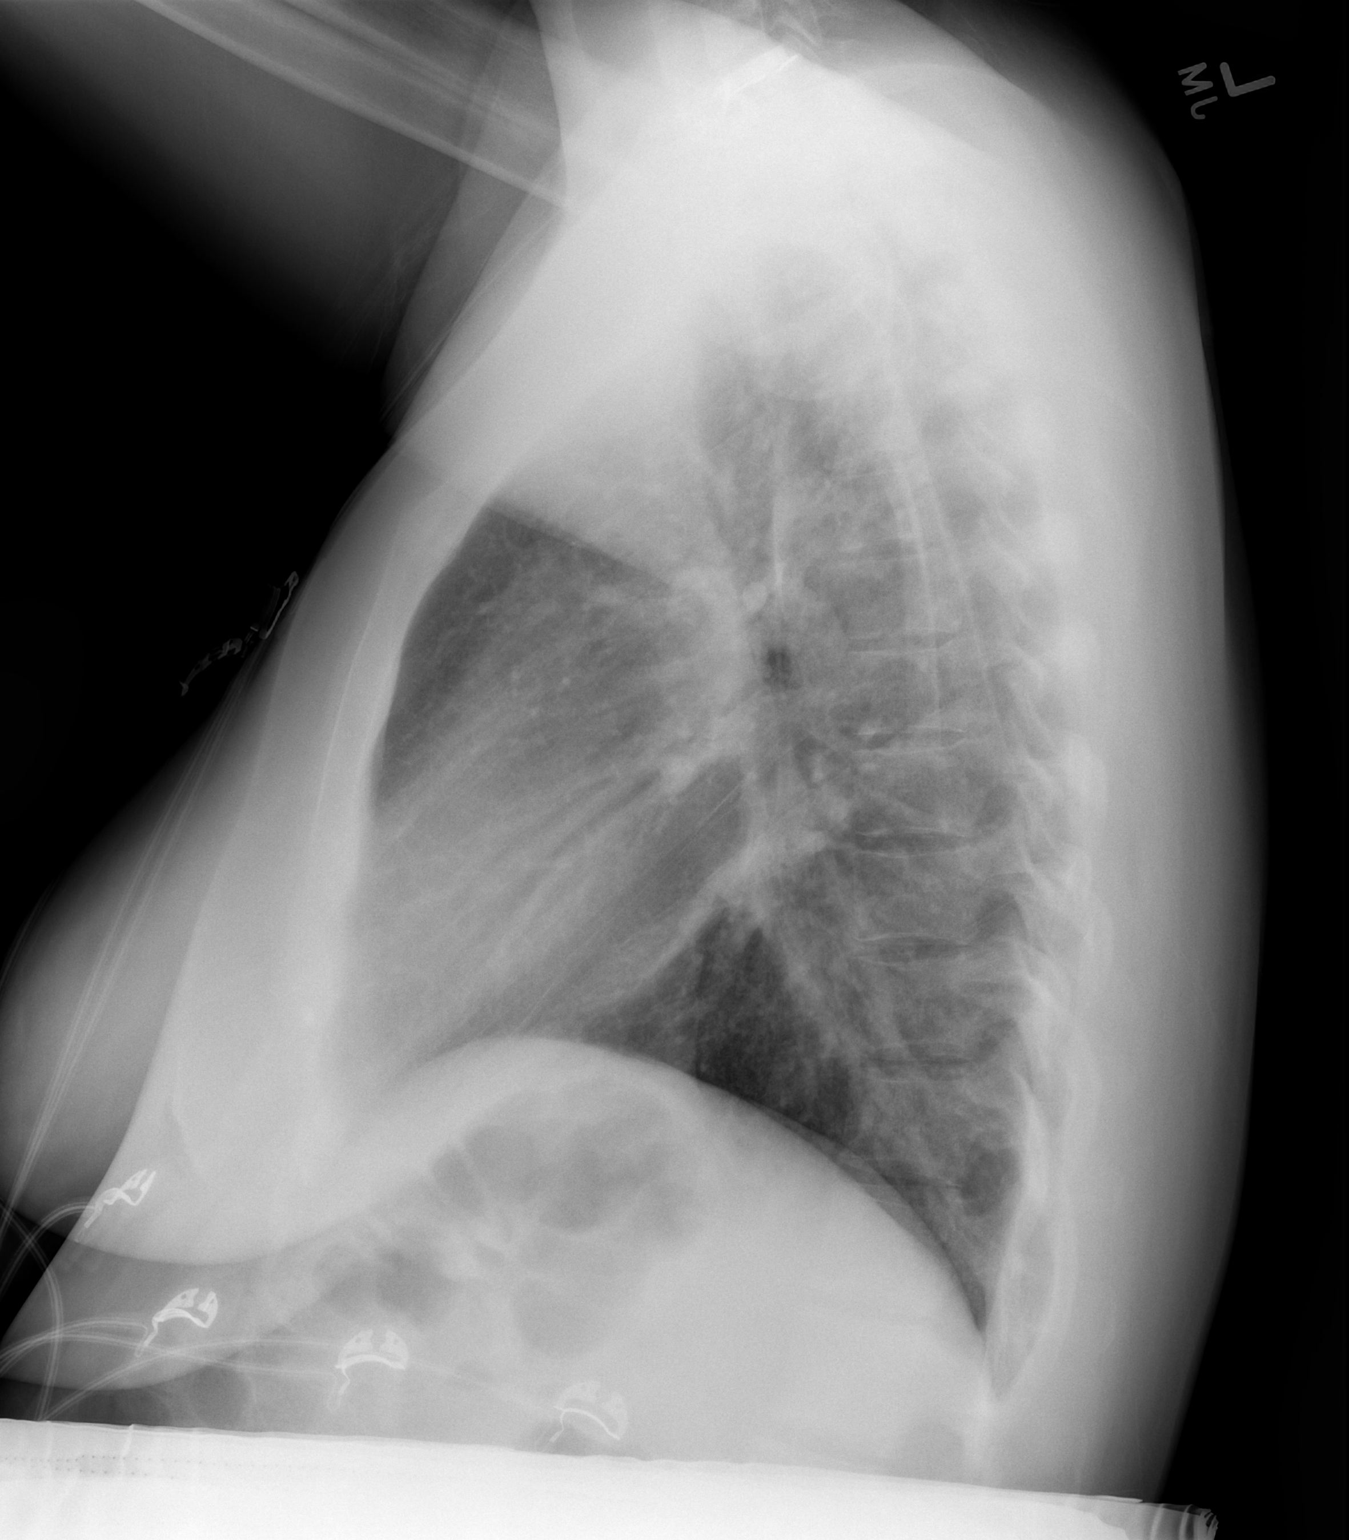

[2 of 2 positions shown; findings below may reference images not displayed]

FINDINGS: The heart size and mediastinal contours are within normal limits.
Both lungs are clear. The visualized skeletal structures are
unremarkable.
IMPRESSION: No active cardiopulmonary disease.

## 2019-07-14 ENCOUNTER — Encounter (HOSPITAL_BASED_OUTPATIENT_CLINIC_OR_DEPARTMENT_OTHER): Payer: Self-pay | Admitting: Emergency Medicine

## 2019-07-14 ENCOUNTER — Other Ambulatory Visit: Payer: Self-pay

## 2019-07-14 ENCOUNTER — Emergency Department (HOSPITAL_BASED_OUTPATIENT_CLINIC_OR_DEPARTMENT_OTHER)
Admission: EM | Admit: 2019-07-14 | Discharge: 2019-07-14 | Disposition: A | Discharge status: Home / Self Care | Payer: Medicaid Other | Attending: Emergency Medicine | Admitting: Emergency Medicine

## 2019-07-14 DIAGNOSIS — R590 Localized enlarged lymph nodes: Secondary | ICD-10-CM | POA: Diagnosis not present

## 2019-07-14 DIAGNOSIS — R070 Pain in throat: Secondary | ICD-10-CM | POA: Diagnosis present

## 2019-07-14 DIAGNOSIS — J029 Acute pharyngitis, unspecified: Secondary | ICD-10-CM | POA: Diagnosis not present

## 2019-07-14 DIAGNOSIS — Z79899 Other long term (current) drug therapy: Secondary | ICD-10-CM | POA: Diagnosis not present

## 2019-07-14 LAB — GROUP A STREP BY PCR: Group A Strep by PCR: NOT DETECTED

## 2019-07-14 MED ORDER — AMOXICILLIN-POT CLAVULANATE 875-125 MG PO TABS
1.0000 | ORAL_TABLET | Freq: Two times a day (BID) | ORAL | 0 refills | Status: AC
Start: 2019-07-14 — End: 2019-07-21

## 2019-07-14 NOTE — ED Provider Notes (Signed)
[facility] EMERGENCY DEPARTMENT Provider Note   CSN: [id number] Arrival date & time: 07/14/19  1149     History Chief Complaint  Patient presents with  . Sore Throat    Bonnie Cross is a 29 y.o. female.  Presents to ER with concern for sore throat.  States that she is been having sore throat for about 10 days now, worse with eating, swallowing.  Seems relatively constant now.  Has been able to eat and drink solids and liquids without difficulty.  No fever.  Has also noted some swollen lymph nodes, also having some ear pain, ear fullness, both sides.  States for the last couple days she has been having a headache as well.  Frontal, not sudden onset, not worse headache of her life, currently moderate to severe, has been managed with OTC Motrin.  Denies any chronic medical conditions.  HPI     Past Medical History:  Diagnosis Date  . Medical history non-contributory     Patient Active Problem List   Diagnosis Date Noted  . Pregnancy 01/16/2018  . Normal labor 01/16/2018  . SVD (spontaneous vaginal delivery) 01/16/2018  . Pruritic condition 12/06/2017  . Anemia complicating pregnancy, third trimester 12/06/2017  . Uterine size date discrepancy, antepartum 10/12/2017  . Breast symptom in antepartum period 10/12/2017  . Spotting affecting pregnancy 06/16/2017  . Cramping affecting pregnancy, antepartum 06/16/2017  . Supervision of other normal pregnancy, antepartum 11/01/2011    Past Surgical History:  Procedure Laterality Date  . NOSE SURGERY       OB History    Gravida  3   Para  3   Term  3   Preterm      AB      Living  3     SAB      TAB      Ectopic      Multiple  0   Live Births  3           Family History  Problem Relation Age of Onset  . Cancer Paternal Uncle        blood CA  . Healthy Mother   . Stroke Father   . Heart disease Father   . Diabetes Neg Hx   . Hearing loss Neg Hx     Social History   Tobacco Use    . Smoking status: Never Smoker  . Smokeless tobacco: Never Used  Substance Use Topics  . Alcohol use: Never  . Drug use: Never    Home Medications Prior to Admission medications   Medication Sig Start Date End Date Taking? Authorizing Provider  amoxicillin-clavulanate (AUGMENTIN) 875-125 MG tablet Take 1 tablet by mouth every 12 (twelve) hours for 7 days. 07/14/19 07/21/19  Lucrezia Starch, MD  ferrous sulfate 325 (65 FE) MG tablet Take 1 tablet (325 mg total) by mouth daily with breakfast. Patient not taking: Reported on 01/16/2018 11/08/17 12/08/17  Wende Mott, CNM  ferrous sulfate 325 (65 FE) MG tablet Take 325 mg by mouth daily with breakfast.    [provider]  folic acid (FOLVITE) 1 MG tablet Take 1 tablet (1 mg total) by mouth daily. Patient not taking: Reported on 01/10/2018 08/10/17   Sloan Leiter, MD  ibuprofen (ADVIL,MOTRIN) 600 MG tablet Take 1 tablet (600 mg total) by mouth every 6 (six) hours. Patient not taking: Reported on 02/03/2018 01/18/18   Glenice Bow, DO    Allergies    Feraheme [ferumoxytol]  Review of Systems   Review of Systems  Constitutional: Negative for chills and fever.  HENT: Positive for ear pain and sore throat.   Eyes: Negative for pain and visual disturbance.  Respiratory: Negative for cough and shortness of breath.   Cardiovascular: Negative for chest pain and palpitations.  Gastrointestinal: Negative for abdominal pain and vomiting.  Genitourinary: Negative for dysuria and hematuria.  Musculoskeletal: Negative for arthralgias and back pain.  Skin: Negative for color change and rash.  Neurological: Negative for seizures and syncope.  All other systems reviewed and are negative.   Physical Exam Updated Vital Signs BP 96/63 (BP Location: Left Arm)   Pulse (!) 107   Temp 98.4 F (36.9 C) (Oral)   Resp 20   Ht 5\' 3"  (1.6 m)   Wt 64.4 kg   LMP 07/06/2019   SpO2 99%   BMI 25.15 kg/m   Physical Exam Vitals and  nursing note reviewed.  Constitutional:      General: She is not in acute distress.    Appearance: She is well-developed.  HENT:     Head: Normocephalic and atraumatic.     Right Ear: Tympanic membrane and ear canal normal. No drainage or swelling.     Left Ear: Tympanic membrane and ear canal normal. No drainage or swelling.     Mouth/Throat:     Tonsils: No tonsillar abscesses.     Comments: Oropharynx is erythematous, there is no exudate Eyes:     Conjunctiva/sclera: Conjunctivae normal.  Neck:     Comments: There is normal neck range of motion, there is bilateral cervical lymphadenopathy, worse on right (large lymph node's less than 1 cm), no supraclavicular infraclavicular or periauricular palpable lymph nodes Cardiovascular:     Rate and Rhythm: Normal rate and regular rhythm.     Heart sounds: No murmur.  Pulmonary:     Effort: Pulmonary effort is normal. No respiratory distress.     Breath sounds: Normal breath sounds.  Abdominal:     Palpations: Abdomen is soft.     Tenderness: There is no abdominal tenderness.  Musculoskeletal:     Cervical back: Neck supple.  Skin:    General: Skin is warm and dry.     Capillary Refill: Capillary refill takes less than 2 seconds.  Neurological:     General: No focal deficit present.     Mental Status: She is alert and oriented to person, place, and time.  Psychiatric:        Mood and Affect: Mood normal.        Behavior: Behavior normal.     ED Results / Procedures / Treatments   Labs (all labs ordered are listed, but only abnormal results are displayed) Labs Reviewed  GROUP A STREP BY PCR    EKG None  Radiology No results found.  Procedures Procedures (including critical care time)  Medications Ordered in ED Medications - No data to display  ED Course  I have reviewed the triage vital signs and the nursing notes.  Pertinent labs & imaging results that were available during my care of the patient were reviewed by  me and considered in my medical decision making (see chart for details).    MDM Rules/Calculators/A&P                      29 year old presented to ER with concern for sore throat.  On exam, patient is very well-appearing, afebrile.  Did note erythematous posterior oropharynx without  exudate.  Rapid strep was negative.  Noted some cervical lymphadenopathy.  Suspect acute viral illness versus possible bacterial pharyngitis.  Will cover with Rx for Augmentin.  Strongly recommended recheck with primary doctor.  Instructed patient that should she have worsening symptoms, such as fever, neck stiffness, swelling, difficulty/inability to tolerate p.o., she should return to ER for additional testing.  Suspect her cervical lymphadenopathy is related to her current acute illness, discussed with patient that should this worsen or not resolve with this acute illness, she should obtain lab work and likely imaging to further investigate.  Given current appearance, symptomatology, believe patient is appropriate for discharge and outpatient management.    After the discussed management above, the patient was determined to be safe for discharge.  The patient was in agreement with this plan and all questions regarding their care were answered.  ED return precautions were discussed and the patient will return to the ED with any significant worsening of condition.    Final Clinical Impression(s) / ED Diagnoses Final diagnoses:  Pharyngitis, unspecified etiology  Cervical lymphadenopathy    Rx / DC Orders ED Discharge Orders         Ordered    amoxicillin-clavulanate (AUGMENTIN) 875-125 MG tablet  Every 12 hours     07/14/19 1404           Milagros Loll, MD 07/15/19 [phone]

## 2019-07-14 NOTE — ED Triage Notes (Signed)
Sore throat x 10 days

## 2019-07-14 NOTE — Discharge Instructions (Signed)
Recommend taking the antibiotic as prescribed.  Please schedule follow-up appointment with your primary doctor for recheck this coming week.  If you feel like you are having neck swelling, fever, vomiting, difficulty in breathing, you should return to ER for reassessment at that time.  If your lymph nodes remain enlarged despite completing the course of antibiotics and getting over this infection, you should discuss potential imaging with your primary doctor.

## 2019-07-14 NOTE — ED Notes (Signed)
ED Provider at bedside. Pharmacy and medications updated with patient

## 2019-07-28 IMAGING — US US MFM OB FOLLOW-UP
1 series · 14 of 28 positions shown · non-contrast
Comparison: none

[Series 1: us mfm ob follow-up · 14 of 58 slices shown]
[im 3/58]
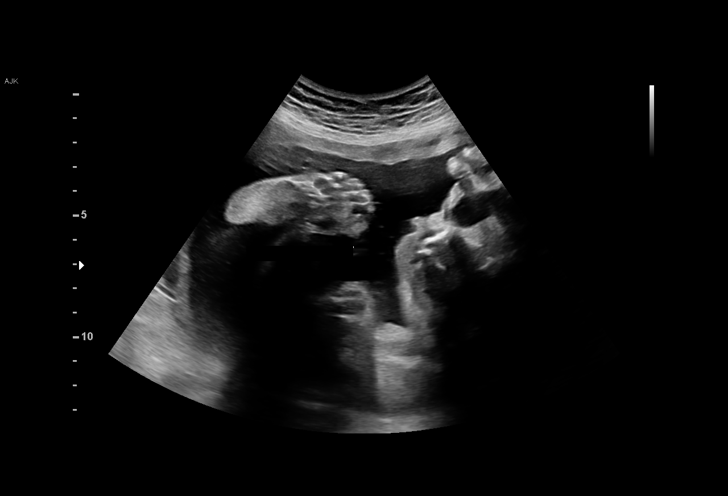
[im 7/58]
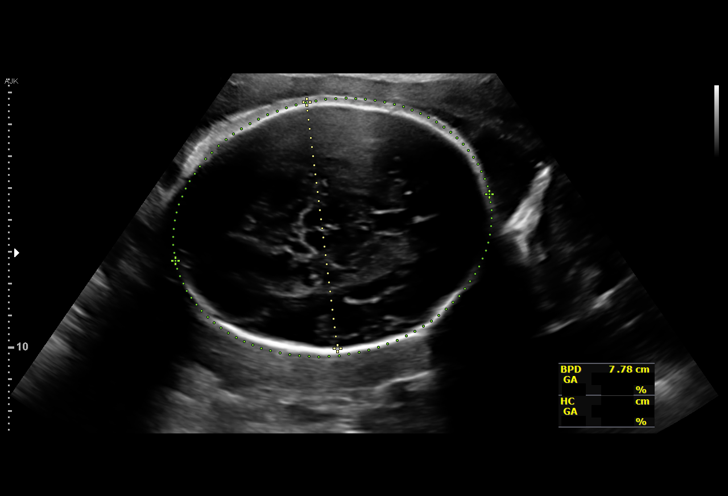
[im 11/58]
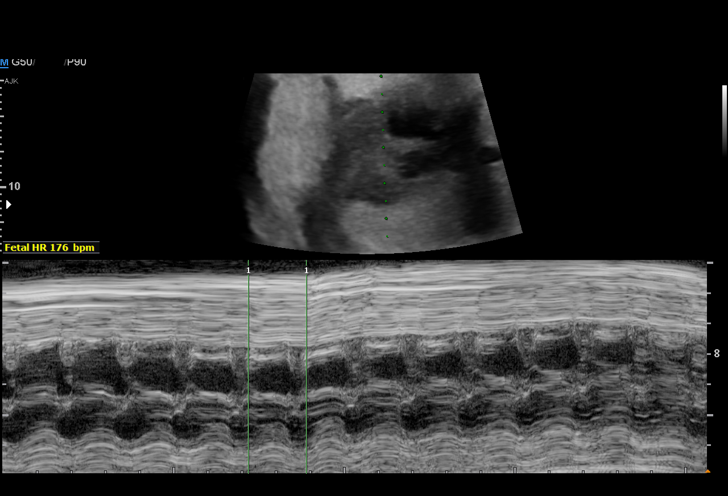
[im 15/58]
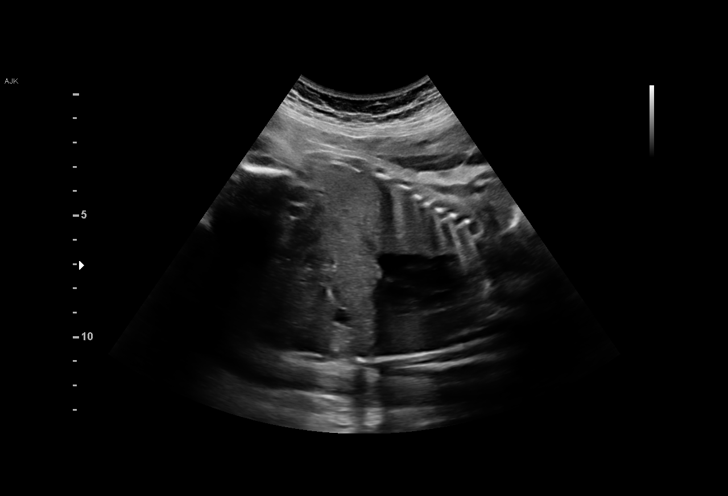
[im 20/58]
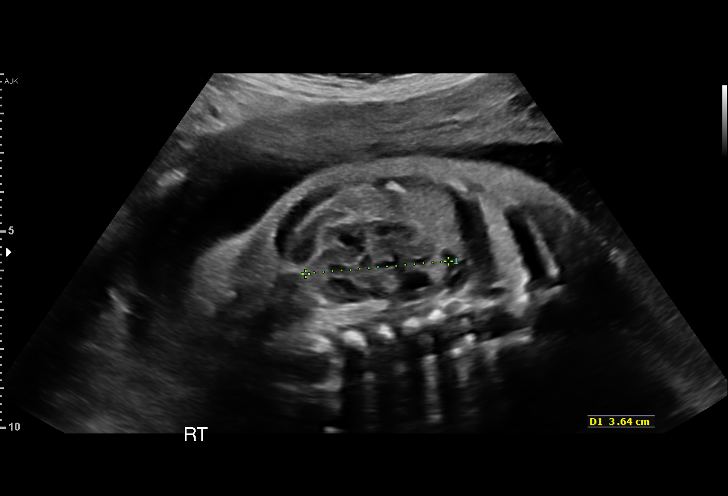
[im 24/58]
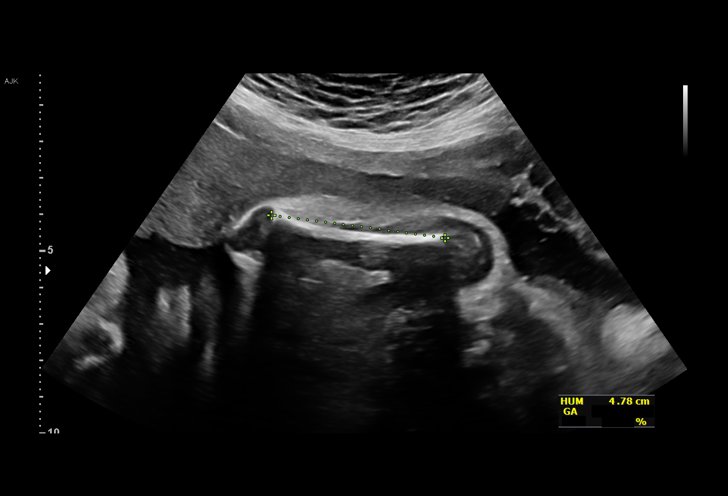
[im 28/58]
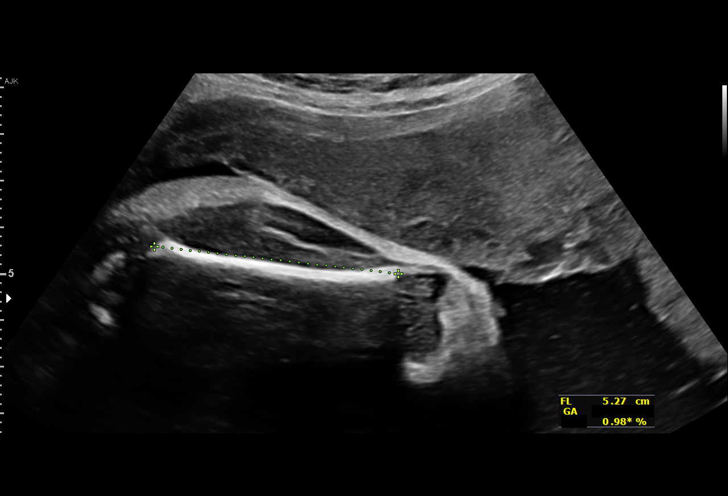
[im 32/58]
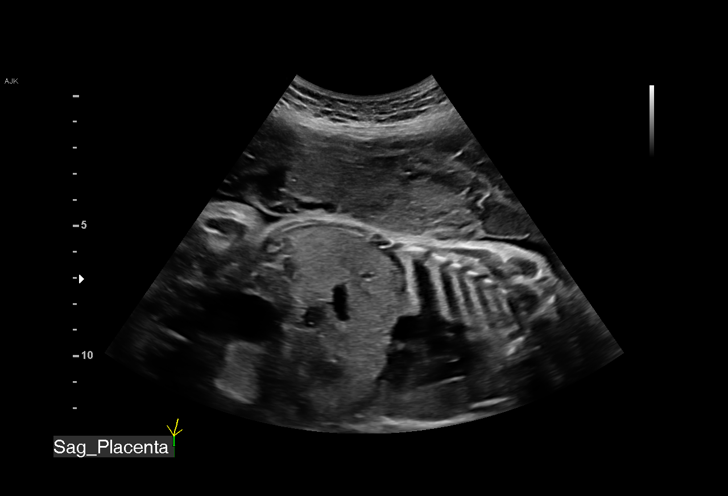
[im 36/58]
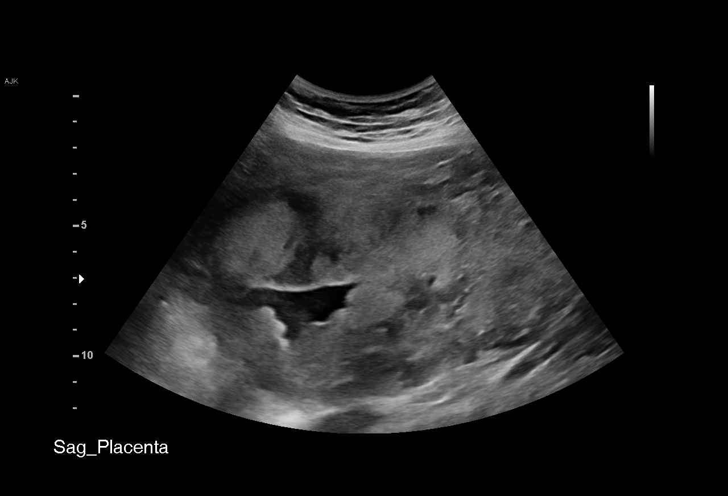
[im 41/58]
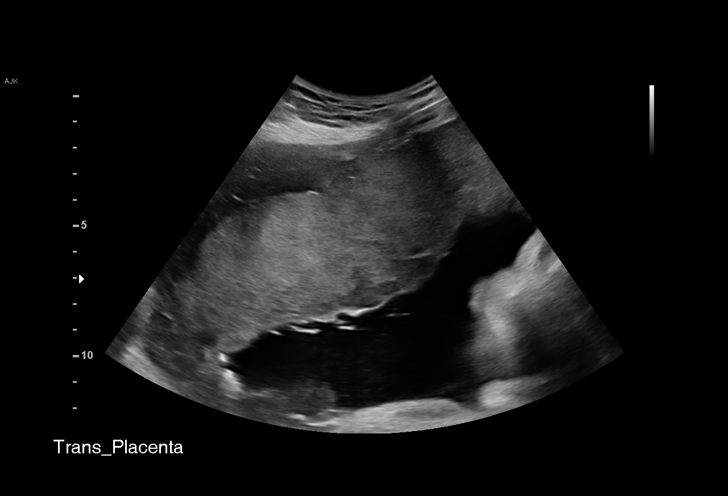
[im 45/58]
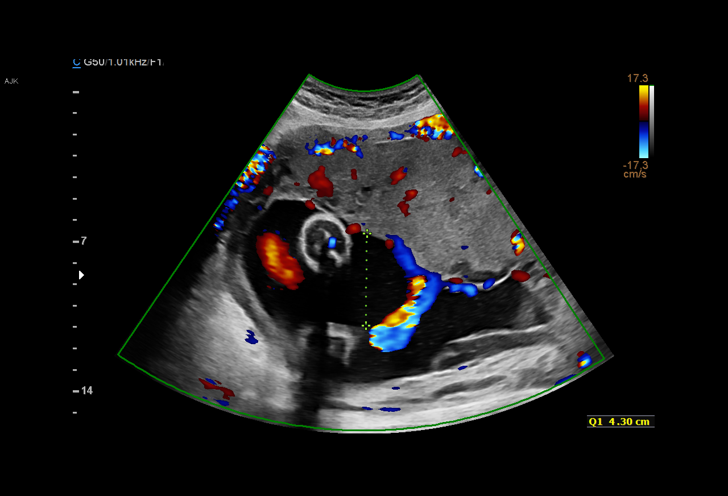
[im 49/58]
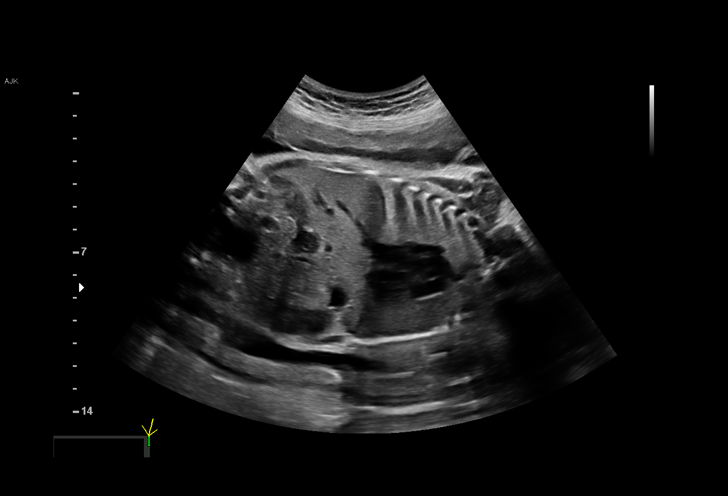
[im 53/58]
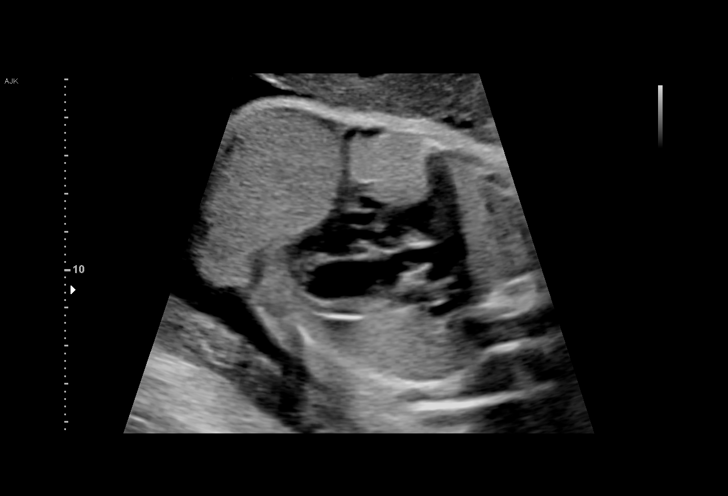
[im 58/58]
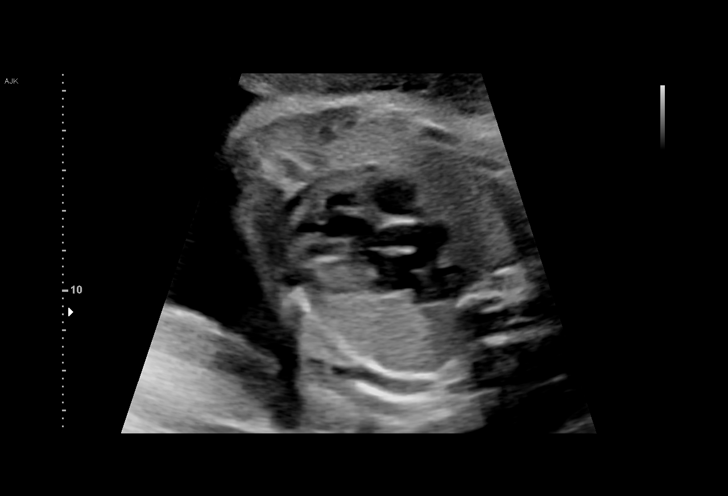

[14 of 28 positions shown; findings below may reference images not displayed]

Indications

Encounter for other antenatal screening
follow-up
Uterine size-date discrepancy, third trimester
30 weeks gestation of pregnancy
Vital Signs

Height:        5'3"
Fetal Evaluation

Num Of Fetuses:         1
Fetal Heart Rate(bpm):  175
Cardiac Activity:       Observed
Presentation:           Cephalic
Placenta:               Anterior
P. Cord Insertion:      Visualized, central

Amniotic Fluid
AFI FV:      Within normal limits

AFI Sum(cm)     %Tile       Largest Pocket(cm)
14.44           50

RUQ(cm)       RLQ(cm)       LUQ(cm)        LLQ(cm)
4.3
Biometry

BPD:      78.2  mm     G. Age:  31w 3d         64  %    CI:        73.51   %    70 - 86
FL/HC:      18.4   %    19.3 -
HC:      289.8  mm     G. Age:  31w 6d         52  %    HC/AC:      0.99        0.96 -
AC:      291.6  mm     G. Age:  33w 1d       > 97  %    FL/BPD:     68.0   %    71 - 87
FL:       53.2  mm     G. Age:  28w 2d        < 3  %    FL/AC:      18.2   %    20 - 24
HUM:      48.6  mm     G. Age:  28w 4d         10  %
TIB:      47.6  mm     G. Age:  28w 6d         15  %
FIB:      44.3  mm     G. Age:  27w 2d         33  %

Est. FW:    5557  gm    3 lb 14 oz      71  %
OB History

Gravidity:    3         Term:   2
Living:       2
Gestational Age

LMP:           30w 4d        Date:  04/22/17                 EDD:   01/27/18
U/S Today:     31w 1d                                        EDD:   01/23/18
Best:          30w 4d     Det. By:  LMP  (04/22/17)          EDD:   01/27/18
Anatomy

Cranium:               Appears normal         Aortic Arch:            Previously seen
Cavum:                 Appears normal         Ductal Arch:            Previously seen
Ventricles:            Appears normal         Diaphragm:              Appears normal
Choroid Plexus:        Previously seen        Stomach:                Appears normal, left
sided
Cerebellum:            Previously seen        Abdomen:                Appears normal
Posterior Fossa:       Previously seen        Abdominal Wall:         Previously seen
Nuchal Fold:           Previously seen        Cord Vessels:           Previously seen
Face:                  Orbits and profile     Kidneys:                Appear normal
previously seen
Lips:                  Appears normal         Bladder:                Appears normal
Thoracic:              Appears normal         Spine:                  Previously seen
Heart:                 Appears normal         Upper Extremities:      Previously seen
(4CH, axis, and situs
RVOT:                  Appears normal         Lower Extremities:      Previously seen
LVOT:                  Appears normal

Other:  Fetus appears to be female. Heels previously seen. Technically
difficult due to fetal motion.
Cervix Uterus Adnexa

Cervix
Not visualized (advanced GA >82wks)

Uterus
No abnormality visualized.

Left Ovary
Not visualized.

Right Ovary
Not visualized.

Adnexa
No abnormality visualized.
Comments

AC greater than the 97th%.
Impression

Normal follow up examination
Recommendations

Follow up as clinically indicated.
Clinical correlation recommended.

## 2019-10-09 ENCOUNTER — Other Ambulatory Visit (HOSPITAL_COMMUNITY)
Admission: RE | Admit: 2019-10-09 | Discharge: 2019-10-09 | Disposition: A | Discharge status: Home / Self Care | Payer: Medicaid Other | Source: Ambulatory Visit | Attending: Advanced Practice Midwife | Admitting: Advanced Practice Midwife

## 2019-10-09 ENCOUNTER — Other Ambulatory Visit: Payer: Self-pay

## 2019-10-09 ENCOUNTER — Encounter: Payer: Self-pay | Admitting: Advanced Practice Midwife

## 2019-10-09 ENCOUNTER — Ambulatory Visit (INDEPENDENT_AMBULATORY_CARE_PROVIDER_SITE_OTHER): Payer: Medicaid Other | Admitting: Advanced Practice Midwife

## 2019-10-09 VITALS — BP 114/75 | HR 94 | Ht 63.0 in | Wt 153.0 lb

## 2019-10-09 DIAGNOSIS — Z331 Pregnant state, incidental: Secondary | ICD-10-CM | POA: Diagnosis not present

## 2019-10-09 DIAGNOSIS — R102 Pelvic and perineal pain: Secondary | ICD-10-CM

## 2019-10-09 DIAGNOSIS — Z3201 Encounter for pregnancy test, result positive: Secondary | ICD-10-CM

## 2019-10-09 DIAGNOSIS — Z01419 Encounter for gynecological examination (general) (routine) without abnormal findings: Secondary | ICD-10-CM | POA: Insufficient documentation

## 2019-10-09 DIAGNOSIS — Z3A01 Less than 8 weeks gestation of pregnancy: Secondary | ICD-10-CM | POA: Diagnosis present

## 2019-10-09 DIAGNOSIS — O26891 Other specified pregnancy related conditions, first trimester: Secondary | ICD-10-CM

## 2019-10-09 NOTE — Progress Notes (Signed)
Patient states that last month she has a abdominal pain with chills- patient thought maybe it was a ruptured cyst.  Patient states she took a pregnancy test at home that was positive. Armandina Stammer RN

## 2019-10-09 NOTE — Progress Notes (Signed)
Chief Complaint: Gynecologic Exam      SUBJECTIVE HPI: Bonnie Cross is a 29 y.o. G4P3003 at by [redacted]w[redacted]d LMP who presents to Office for annual GYN exam   States she had pain at ovulation and had a positive pregnancy test at home this week.  Was trying to get pregnant for the last few months.  She denies vaginal bleeding, vaginal itching/burning, urinary symptoms, h/a, dizziness, n/v, or fever/chills.   Has been having cramping in the suprapubic area for a week  No bleeding  Gynecologic Exam The patient's primary symptoms include pelvic pain. The patient's pertinent negatives include no genital itching, genital lesions, genital odor or vaginal bleeding. This is a new problem. The current episode started in the past 7 days. The problem occurs intermittently. The problem has been unchanged. She is pregnant. Associated symptoms include abdominal pain. Pertinent negatives include no back pain, chills, constipation, diarrhea, dysuria, fever, frequency, headaches, nausea or vomiting. The symptoms are aggravated by tactile pressure. She has tried nothing for the symptoms. She is sexually active. No, her partner does not have an STD.    Past Medical History:  Diagnosis Date   Medical history non-contributory    Past Surgical History:  Procedure Laterality Date   NOSE SURGERY     Social History   Socioeconomic History   Marital status: Single    Spouse name: Not on file   Number of children: Not on file   Years of education: Not on file   Highest education level: Not on file  Occupational History   Not on file  Tobacco Use   Smoking status: Never Smoker   Smokeless tobacco: Never Used  Vaping Use   Vaping Use: Never used  Substance and Sexual Activity   Alcohol use: Never   Drug use: Never   Sexual activity: Yes    Birth control/protection: None  Other Topics Concern   Not on file  Social History Narrative   Not on file   Social Determinants of Health   Financial  Resource Strain:    Difficulty of Paying Living Expenses:   Food Insecurity:    Worried About Programme researcher, broadcasting/film/video in the Last Year:    Barista in the Last Year:   Transportation Needs:    Freight forwarder (Medical):    Lack of Transportation (Non-Medical):   Physical Activity:    Days of Exercise per Week:    Minutes of Exercise per Session:   Stress:    Feeling of Stress :   Social Connections:    Frequency of Communication with Friends and Family:    Frequency of Social Gatherings with Friends and Family:    Attends Religious Services:    Active Member of Clubs or Organizations:    Attends Engineer, structural:    Marital Status:   Intimate Partner Violence:    Fear of Current or Ex-Partner:    Emotionally Abused:    Physically Abused:    Sexually Abused:    Current Outpatient Medications on File Prior to Visit  Medication Sig Dispense Refill   Prenatal Vit-Fe Fumarate-FA (MULTIVITAMIN-PRENATAL) 27-0.8 MG TABS tablet Take 1 tablet by mouth daily at 12 noon.     ferrous sulfate 325 (65 FE) MG tablet Take 1 tablet (325 mg total) by mouth daily with breakfast. (Patient not taking: Reported on 01/16/2018) 30 tablet 0   ferrous sulfate 325 (65 FE) MG tablet Take 325 mg by mouth daily with breakfast. (Patient not  taking: Reported on 10/09/2019)     folic acid (FOLVITE) 1 MG tablet Take 1 tablet (1 mg total) by mouth daily. (Patient not taking: Reported on 01/10/2018) 90 tablet 3   No current facility-administered medications on file prior to visit.   Allergies  Allergen Reactions   Feraheme [Ferumoxytol] Shortness Of Breath, Anxiety and Other (See Comments)    I have reviewed patient's Past Medical Hx, Surgical Hx, Family Hx, Social Hx, medications and allergies.   ROS:  Review of Systems  Constitutional: Negative for chills and fever.  Gastrointestinal: Positive for abdominal pain. Negative for constipation, diarrhea, nausea and  vomiting.  Genitourinary: Positive for pelvic pain. Negative for dysuria and frequency.  Musculoskeletal: Negative for back pain.  Neurological: Negative for dizziness and headaches.   Review of Systems  Other systems negative   Physical Exam  Physical Exam Constitutional:      General: She is not in acute distress.    Appearance: Normal appearance. She is normal weight. She is not ill-appearing.  Cardiovascular:     Rate and Rhythm: Normal rate and regular rhythm.     Heart sounds: Normal heart sounds.  Pulmonary:     Effort: Pulmonary effort is normal.     Breath sounds: No wheezing.  Abdominal:     General: Abdomen is flat. There is no distension.     Palpations: Abdomen is soft. There is no mass.     Tenderness: There is abdominal tenderness (SP area). There is no guarding or rebound.  Genitourinary:    General: Normal vulva.     Comments: EGBUS normal Vagina pink No lesions Pap done Uterus tender Bilateral adnexae nontender, no masses  Musculoskeletal:        General: Normal range of motion.     Cervical back: Normal range of motion.  Skin:    General: Skin is warm and dry.  Neurological:     General: No focal deficit present.     Mental Status: She is alert.  Psychiatric:        Mood and Affect: Mood normal.     LAB RESULTS HCG pending     IMAGING [place] by RN showed IUGS in uterus, ?fetal pole No adnexal masses  HCG sent.   Pelvic exam and cultures done Will check baseline Ultrasound to rule out ectopic.  This indicated IUGS  This bleeding/pain can represent a normal pregnancy with bleeding, spontaneous abortion or even an ectopic which can be life-threatening.  The process as listed above helps to determine which of these is present.    ASSESSMENT 1. Well woman exam   2. [redacted] weeks gestation of pregnancy     PLAN Plan to repeat HCG level in 48 hours in office Will repeat  Ultrasound in about 7-10 days if HCG levels double appropriately  Ectopic  precautions Will schedule New OB once we see viable pregnancy  Encouraged to return here or to other Urgent Care/ED if she develops worsening of symptoms, increase in pain, fever, or other concerning symptoms.    Wynelle Bourgeois CNM, MSN Certified Nurse-Midwife 10/09/2019  12:15 PM

## 2019-10-09 NOTE — Patient Instructions (Signed)
First Trimester of Pregnancy  The first trimester of pregnancy is from week 1 until the end of week 13 (months 1 through 3). During this time, your baby will begin to develop inside you. At 6-8 weeks, the eyes and face are formed, and the heartbeat can be seen on ultrasound. At the end of 12 weeks, all the baby's organs are formed. Prenatal care is all the medical care you receive before the birth of your baby. Make sure you get good prenatal care and follow all of your doctor's instructions. Follow these instructions at home: Medicines  Take over-the-counter and prescription medicines only as told by your doctor. Some medicines are safe and some medicines are not safe during pregnancy.  Take a prenatal vitamin that contains at least 600 micrograms (mcg) of folic acid.  If you have trouble pooping (constipation), take medicine that will make your stool soft (stool softener) if your doctor approves. Eating and drinking   Eat regular, healthy meals.  Your doctor will tell you the amount of weight gain that is right for you.  Avoid raw meat and uncooked cheese.  If you feel sick to your stomach (nauseous) or throw up (vomit): ? Eat 4 or 5 small meals a day instead of 3 large meals. ? Try eating a few soda crackers. ? Drink liquids between meals instead of during meals.  To prevent constipation: ? Eat foods that are high in fiber, like fresh fruits and vegetables, whole grains, and beans. ? Drink enough fluids to keep your pee (urine) clear or pale yellow. Activity  Exercise only as told by your doctor. Stop exercising if you have cramps or pain in your lower belly (abdomen) or low back.  Do not exercise if it is too hot, too humid, or if you are in a place of great height (high altitude).  Try to avoid standing for long periods of time. Move your legs often if you must stand in one place for a long time.  Avoid heavy lifting.  Wear low-heeled shoes. Sit and stand up  straight.  You can have sex unless your doctor tells you not to. Relieving pain and discomfort  Wear a good support bra if your breasts are sore.  Take warm water baths (sitz baths) to soothe pain or discomfort caused by hemorrhoids. Use hemorrhoid cream if your doctor says it is okay.  Rest with your legs raised if you have leg cramps or low back pain.  If you have puffy, bulging veins (varicose veins) in your legs: ? Wear support hose or compression stockings as told by your doctor. ? Raise (elevate) your feet for 15 minutes, 3-4 times a day. ? Limit salt in your food. Prenatal care  Schedule your prenatal visits by the twelfth week of pregnancy.  Write down your questions. Take them to your prenatal visits.  Keep all your prenatal visits as told by your doctor. This is important. Safety  Wear your seat belt at all times when driving.  Make a list of emergency phone numbers. The list should include numbers for family, friends, the hospital, and police and fire departments. General instructions  Ask your doctor for a referral to a local prenatal class. Begin classes no later than at the start of month 6 of your pregnancy.  Ask for help if you need counseling or if you need help with nutrition. Your doctor can give you advice or tell you where to go for help.  Do not use hot tubs, steam   rooms, or saunas.  Do not douche or use tampons or scented sanitary pads.  Do not cross your legs for long periods of time.  Avoid all herbs and alcohol. Avoid drugs that are not approved by your doctor.  Do not use any tobacco products, including cigarettes, chewing tobacco, and electronic cigarettes. If you need help quitting, ask your doctor. You may get counseling or other support to help you quit.  Avoid cat litter boxes and soil used by cats. These carry germs that can cause birth defects in the baby and can cause a loss of your baby (miscarriage) or stillbirth.  Visit your dentist.  At home, brush your teeth with a soft toothbrush. Be gentle when you floss. Contact a doctor if:  You are dizzy.  You have mild cramps or pressure in your lower belly.  You have a nagging pain in your belly area.  You continue to feel sick to your stomach, you throw up, or you have watery poop (diarrhea).  You have a bad smelling fluid coming from your vagina.  You have pain when you pee (urinate).  You have increased puffiness (swelling) in your face, hands, legs, or ankles. Get help right away if:  You have a fever.  You are leaking fluid from your vagina.  You have spotting or bleeding from your vagina.  You have very bad belly cramping or pain.  You gain or lose weight rapidly.  You throw up blood. It may look like coffee grounds.  You are around people who have German measles, fifth disease, or chickenpox.  You have a very bad headache.  You have shortness of breath.  You have any kind of trauma, such as from a fall or a car accident. Summary  The first trimester of pregnancy is from week 1 until the end of week 13 (months 1 through 3).  To take care of yourself and your unborn baby, you will need to eat healthy meals, take medicines only if your doctor tells you to do so, and do activities that are safe for you and your baby.  Keep all follow-up visits as told by your doctor. This is important as your doctor will have to ensure that your baby is healthy and growing well. This information is not intended to replace advice given to you by your health care provider. Make sure you discuss any questions you have with your health care provider. Document Revised: 06/01/2018 Document Reviewed: 02/17/2016 Elsevier Patient Education  2020 [employer]  

## 2019-10-10 LAB — CBC/D/PLT+RPR+RH+ABO+RUB AB...
Antibody Screen: NEGATIVE
Basophils Absolute: 0.1 10*3/uL (ref 0.0–0.2)
Basos: 1 %
EOS (ABSOLUTE): 0 10*3/uL (ref 0.0–0.4)
Eos: 1 %
HCV Ab: <0.1 s/co ratio (ref 0.0–0.9)
HIV Screen 4th Generation wRfx: NONREACTIVE
Hematocrit: 37.4 % (ref 34.0–46.6)
Hemoglobin: 12 g/dL (ref 11.1–15.9)
Hepatitis B Surface Ag: NEGATIVE
Immature Grans (Abs): 0 10*3/uL (ref 0.0–0.1)
Immature Granulocytes: 0 %
Lymphocytes Absolute: 1.7 10*3/uL (ref 0.7–3.1)
Lymphs: 30 %
MCH: 27.6 pg (ref 26.6–33.0)
MCHC: 32.1 g/dL (ref 31.5–35.7)
MCV: 86 fL (ref 79–97)
Monocytes Absolute: 0.5 10*3/uL (ref 0.1–0.9)
Monocytes: 8 %
Neutrophils Absolute: 3.4 10*3/uL (ref 1.4–7.0)
Neutrophils: 60 %
Platelets: 269 10*3/uL (ref 150–450)
RBC: 4.34 x10E6/uL (ref 3.77–5.28)
RDW: 14 % (ref 11.7–15.4)
RPR Ser Ql: NONREACTIVE
Rh Factor: POSITIVE
Rubella Antibodies, IGG: 22.8 index (ref >0.99)
WBC: 5.7 10*3/uL (ref 3.4–10.8)

## 2019-10-10 LAB — BETA HCG QUANT (REF LAB): hCG Quant: 9584 m[IU]/mL

## 2019-10-10 LAB — HCV INTERPRETATION

## 2019-10-11 ENCOUNTER — Other Ambulatory Visit: Payer: Self-pay

## 2019-10-11 ENCOUNTER — Encounter: Payer: Self-pay | Admitting: Advanced Practice Midwife

## 2019-10-11 DIAGNOSIS — Z3480 Encounter for supervision of other normal pregnancy, unspecified trimester: Secondary | ICD-10-CM | POA: Insufficient documentation

## 2019-10-11 DIAGNOSIS — O3680X0 Pregnancy with inconclusive fetal viability, not applicable or unspecified: Secondary | ICD-10-CM

## 2019-10-11 LAB — CYTOLOGY - PAP
Chlamydia: NEGATIVE
Comment: NEGATIVE
Comment: NORMAL
Diagnosis: NEGATIVE
Neisseria Gonorrhea: NEGATIVE

## 2019-10-11 LAB — URINE CULTURE

## 2019-10-12 ENCOUNTER — Other Ambulatory Visit: Payer: Self-pay

## 2019-10-12 ENCOUNTER — Ambulatory Visit: Payer: Medicaid Other

## 2019-10-12 DIAGNOSIS — O3680X0 Pregnancy with inconclusive fetal viability, not applicable or unspecified: Secondary | ICD-10-CM

## 2019-10-12 NOTE — Progress Notes (Signed)
Patient sent to lab for repeat hcg.  Patient also set up for follow up ultrasound with imaging department. Armandina Stammer RN

## 2019-10-13 LAB — BETA HCG QUANT (REF LAB): hCG Quant: 19019 m[IU]/mL

## 2019-10-16 ENCOUNTER — Ambulatory Visit (HOSPITAL_BASED_OUTPATIENT_CLINIC_OR_DEPARTMENT_OTHER)
Admission: RE | Admit: 2019-10-16 | Discharge: 2019-10-16 | Disposition: A | Discharge status: Home / Self Care | Payer: Medicaid Other | Source: Ambulatory Visit | Attending: Advanced Practice Midwife | Admitting: Advanced Practice Midwife

## 2019-10-16 ENCOUNTER — Other Ambulatory Visit: Payer: Self-pay | Admitting: Advanced Practice Midwife

## 2019-10-16 ENCOUNTER — Other Ambulatory Visit: Payer: Self-pay

## 2019-10-16 DIAGNOSIS — O3680X0 Pregnancy with inconclusive fetal viability, not applicable or unspecified: Secondary | ICD-10-CM | POA: Diagnosis not present

## 2019-10-16 LAB — POCT URINE PREGNANCY: Preg Test, Ur: POSITIVE — AB

## 2019-11-06 ENCOUNTER — Encounter: Payer: Self-pay | Admitting: Advanced Practice Midwife

## 2019-11-06 ENCOUNTER — Other Ambulatory Visit: Payer: Self-pay

## 2019-11-06 ENCOUNTER — Ambulatory Visit (INDEPENDENT_AMBULATORY_CARE_PROVIDER_SITE_OTHER): Payer: Medicaid Other | Admitting: Advanced Practice Midwife

## 2019-11-06 VITALS — Wt 152.1 lb

## 2019-11-06 DIAGNOSIS — Z3A09 9 weeks gestation of pregnancy: Secondary | ICD-10-CM

## 2019-11-06 DIAGNOSIS — E0590 Thyrotoxicosis, unspecified without thyrotoxic crisis or storm: Secondary | ICD-10-CM

## 2019-11-06 DIAGNOSIS — O99281 Endocrine, nutritional and metabolic diseases complicating pregnancy, first trimester: Secondary | ICD-10-CM

## 2019-11-06 DIAGNOSIS — Z3480 Encounter for supervision of other normal pregnancy, unspecified trimester: Secondary | ICD-10-CM

## 2019-11-06 NOTE — Patient Instructions (Signed)
First Trimester of Pregnancy  The first trimester of pregnancy is from week 1 until the end of week 13 (months 1 through 3). During this time, your baby will begin to develop inside you. At 6-8 weeks, the eyes and face are formed, and the heartbeat can be seen on ultrasound. At the end of 12 weeks, all the baby's organs are formed. Prenatal care is all the medical care you receive before the birth of your baby. Make sure you get good prenatal care and follow all of your doctor's instructions. Follow these instructions at home: Medicines  Take over-the-counter and prescription medicines only as told by your doctor. Some medicines are safe and some medicines are not safe during pregnancy.  Take a prenatal vitamin that contains at least 600 micrograms (mcg) of folic acid.  If you have trouble pooping (constipation), take medicine that will make your stool soft (stool softener) if your doctor approves. Eating and drinking   Eat regular, healthy meals.  Your doctor will tell you the amount of weight gain that is right for you.  Avoid raw meat and uncooked cheese.  If you feel sick to your stomach (nauseous) or throw up (vomit): ? Eat 4 or 5 small meals a day instead of 3 large meals. ? Try eating a few soda crackers. ? Drink liquids between meals instead of during meals.  To prevent constipation: ? Eat foods that are high in fiber, like fresh fruits and vegetables, whole grains, and beans. ? Drink enough fluids to keep your pee (urine) clear or pale yellow. Activity  Exercise only as told by your doctor. Stop exercising if you have cramps or pain in your lower belly (abdomen) or low back.  Do not exercise if it is too hot, too humid, or if you are in a place of great height (high altitude).  Try to avoid standing for long periods of time. Move your legs often if you must stand in one place for a long time.  Avoid heavy lifting.  Wear low-heeled shoes. Sit and stand up  straight.  You can have sex unless your doctor tells you not to. Relieving pain and discomfort  Wear a good support bra if your breasts are sore.  Take warm water baths (sitz baths) to soothe pain or discomfort caused by hemorrhoids. Use hemorrhoid cream if your doctor says it is okay.  Rest with your legs raised if you have leg cramps or low back pain.  If you have puffy, bulging veins (varicose veins) in your legs: ? Wear support hose or compression stockings as told by your doctor. ? Raise (elevate) your feet for 15 minutes, 3-4 times a day. ? Limit salt in your food. Prenatal care  Schedule your prenatal visits by the twelfth week of pregnancy.  Write down your questions. Take them to your prenatal visits.  Keep all your prenatal visits as told by your doctor. This is important. Safety  Wear your seat belt at all times when driving.  Make a list of emergency phone numbers. The list should include numbers for family, friends, the hospital, and police and fire departments. General instructions  Ask your doctor for a referral to a local prenatal class. Begin classes no later than at the start of month 6 of your pregnancy.  Ask for help if you need counseling or if you need help with nutrition. Your doctor can give you advice or tell you where to go for help.  Do not use hot tubs, steam   rooms, or saunas.  Do not douche or use tampons or scented sanitary pads.  Do not cross your legs for long periods of time.  Avoid all herbs and alcohol. Avoid drugs that are not approved by your doctor.  Do not use any tobacco products, including cigarettes, chewing tobacco, and electronic cigarettes. If you need help quitting, ask your doctor. You may get counseling or other support to help you quit.  Avoid cat litter boxes and soil used by cats. These carry germs that can cause birth defects in the baby and can cause a loss of your baby (miscarriage) or stillbirth.  Visit your dentist.  At home, brush your teeth with a soft toothbrush. Be gentle when you floss. Contact a doctor if:  You are dizzy.  You have mild cramps or pressure in your lower belly.  You have a nagging pain in your belly area.  You continue to feel sick to your stomach, you throw up, or you have watery poop (diarrhea).  You have a bad smelling fluid coming from your vagina.  You have pain when you pee (urinate).  You have increased puffiness (swelling) in your face, hands, legs, or ankles. Get help right away if:  You have a fever.  You are leaking fluid from your vagina.  You have spotting or bleeding from your vagina.  You have very bad belly cramping or pain.  You gain or lose weight rapidly.  You throw up blood. It may look like coffee grounds.  You are around people who have German measles, fifth disease, or chickenpox.  You have a very bad headache.  You have shortness of breath.  You have any kind of trauma, such as from a fall or a car accident. Summary  The first trimester of pregnancy is from week 1 until the end of week 13 (months 1 through 3).  To take care of yourself and your unborn baby, you will need to eat healthy meals, take medicines only if your doctor tells you to do so, and do activities that are safe for you and your baby.  Keep all follow-up visits as told by your doctor. This is important as your doctor will have to ensure that your baby is healthy and growing well. This information is not intended to replace advice given to you by your health care provider. Make sure you discuss any questions you have with your health care provider. Document Revised: 06/01/2018 Document Reviewed: 02/17/2016 Elsevier Patient Education  2020 [employer]  

## 2019-11-06 NOTE — Progress Notes (Signed)
Subjective:    Bonnie Cross is a [id number] [redacted]w[redacted]d being seen today for her first obstetrical visit.  Her obstetrical history is significant for Multipara. Patient does intend to breast feed. Pregnancy history fully reviewed.  Patient reports nausea.  Vitals:   11/06/19 1018  Weight: 152 lb 1.3 oz (69 kg)    HISTORY: OB History  Gravida Para Term Preterm AB Living  4 3 3     3   SAB TAB Ectopic Multiple Live Births        0 3    # Outcome Date GA Lbr Len/2nd Weight Sex Delivery Anes PTL Lv  4 Current           3 Term 01/16/18 [redacted]w[redacted]d 12:30 / 00:04 7 lb 4.4 oz (3.3 kg) F Vag-Spont None  LIV     Birth Comments: WNL  2 Term 2014    F Vag-Spont   LIV  1 Term 2012    F Vag-Spont   LIV   Past Medical History:  Diagnosis Date  . Medical history non-contributory    Past Surgical History:  Procedure Laterality Date  . NOSE SURGERY     Family History  Problem Relation Age of Onset  . Cancer Paternal Uncle        blood CA  . Healthy Mother   . Stroke Father   . Heart disease Father   . Diabetes Neg Hx   . Hearing loss Neg Hx      Exam    Uterus:     Pelvic Exam:    Perineum: No Hemorrhoids   Vulva: Bartholin's, Urethra, Skene's normal   Vagina:  normal mucosa   pH:    Cervix: no cervical motion tenderness   Adnexa: no mass, fullness, tenderness   Bony Pelvis: gynecoid  System: Breast:  normal appearance, no masses or tenderness   Skin: normal coloration and turgor, no rashes    Neurologic: oriented, grossly non-focal   Extremities: normal strength, tone, and muscle mass   HEENT neck supple with midline trachea   Mouth/Teeth mucous membranes moist, pharynx normal without lesions   Neck supple   Cardiovascular: regular rate and rhythm   Respiratory:  appears well, vitals normal, no respiratory distress, acyanotic, normal RR, ear and throat exam is normal   Abdomen: soft, non-tender; bowel sounds normal; no masses,  no organomegaly   Urinary: urethral meatus normal       Assessment:    Pregnancy: 01-19-1974 Patient Active Problem List   Diagnosis Date Noted  . Supervision of other normal pregnancy, antepartum 10/11/2019  . Chronic neck pain 12/22/2018  . Vitamin D deficiency 12/22/2018  . Polyarthralgia 12/22/2018  . Pregnancy 01/16/2018  . Normal labor 01/16/2018  . SVD (spontaneous vaginal delivery) 01/16/2018  . Pelvic pain affecting pregnancy 06/16/2017        Plan:     Initial labs drawn last visit.  Hx anemia:  Hgb 12 now Continue prenatal vitamins. Discussed and offered genetic screening options, including Quad screen/AFP, NIPS testing, and option to decline testing. Benefits/risks/alternatives reviewed. Pt aware that anatomy 06/18/2017 is form of genetic screening with lower accuracy in detecting trisomies than blood work.  Pt chooses genetic screening today. NIPS: declined Ultrasound discussed; fetal anatomic survey: ordered. Problem list reviewed and updated. The nature of [facility] with multiple MDs and other Advanced Practice Providers was explained to patient; also emphasized that residents, students are part of our team. Routine obstetric precautions reviewed.  Labs drawn last visit, except SMA which she declines  Follow up in 4 weeks. 50% of 30 min visit spent on counseling and coordination of care.     Wynelle Bourgeois 11/06/2019

## 2019-11-22 ENCOUNTER — Other Ambulatory Visit: Payer: Medicaid Other

## 2019-11-26 ENCOUNTER — Telehealth: Payer: Self-pay

## 2019-11-26 NOTE — Telephone Encounter (Signed)
Pt called wanting to know if she can keep her appt because she tested positive for COVID on 11/22/19. Pt made aware that she will need to quarantine for 10 days. Understanding was voiced.  Shai Mckenzie l Felisha Claytor, CMA

## 2019-12-04 ENCOUNTER — Other Ambulatory Visit: Payer: Self-pay

## 2019-12-04 ENCOUNTER — Encounter: Payer: Self-pay | Admitting: Advanced Practice Midwife

## 2019-12-04 ENCOUNTER — Ambulatory Visit (INDEPENDENT_AMBULATORY_CARE_PROVIDER_SITE_OTHER): Payer: Medicaid Other | Admitting: Advanced Practice Midwife

## 2019-12-04 VITALS — BP 99/64 | HR 102 | Temp 99.1°F | Wt 152.4 lb

## 2019-12-04 DIAGNOSIS — E0590 Thyrotoxicosis, unspecified without thyrotoxic crisis or storm: Secondary | ICD-10-CM | POA: Insufficient documentation

## 2019-12-04 DIAGNOSIS — O98511 Other viral diseases complicating pregnancy, first trimester: Secondary | ICD-10-CM

## 2019-12-04 DIAGNOSIS — U071 COVID-19: Secondary | ICD-10-CM | POA: Insufficient documentation

## 2019-12-04 DIAGNOSIS — Z3480 Encounter for supervision of other normal pregnancy, unspecified trimester: Secondary | ICD-10-CM

## 2019-12-04 HISTORY — DX: COVID-19: U07.1

## 2019-12-04 NOTE — Progress Notes (Signed)
   PRENATAL VISIT NOTE  Subjective:  Bonnie Cross is a 29 y.o. G4P3003 at [redacted]w[redacted]d being seen today for ongoing prenatal care.  She is currently monitored for the following issues for this high-risk pregnancy and has Pelvic pain affecting pregnancy; Chronic neck pain; Supervision of other normal pregnancy, antepartum; Vitamin D deficiency; Polyarthralgia; COVID-19 affecting pregnancy in first trimester; and Hyperthyroidism on their problem list.  Patient reports Having had Covid on 9/30 and "doesn't feel pregnant",  Did well and has recovered.  Contractions: Not present. Vag. Bleeding: None.  Movement: Absent. Denies leaking of fluid.   The following portions of the patient's history were reviewed and updated as appropriate: allergies, current medications, past family history, past medical history, past social history, past surgical history and problem list.   Objective:   Vitals:   12/04/19 0803  BP: 99/64  Pulse: (!) 102  Temp: 99.1 F (37.3 C)  Weight: 152 lb 6.4 oz (69.1 kg)    Fetal Status: Fetal Heart Rate (bpm): 151 Fundal Height: 13 cm Movement: Absent     General:  Alert, oriented and cooperative. Patient is in no acute distress.  Skin: Skin is warm and dry. No rash noted.   Cardiovascular: Normal heart rate noted  Respiratory: Normal respiratory effort, no problems with respiration noted  Abdomen: Soft, gravid, appropriate for gestational age.  Pain/Pressure: Absent     Pelvic: Cervical exam deferred        Extremities: Normal range of motion.  Edema: None  Mental Status: Normal mood and affect. Normal behavior. Normal judgment and thought content.   Assessment and Plan:  Pregnancy: G4P3003 at [redacted]w[redacted]d 1. COVID-19 affecting pregnancy in first trimester     Aches and mild fever for 7 days.  Quarantined x 10d  Husb had vaccine.   COVID-19 Vaccine Counseling: The patient was counseled on the potential benefits and lack of known risks of COVID vaccination, during pregnancy and  breastfeeding, during today's visit. The patient's questions and concerns were addressed today, including safety of the vaccination and potential side effects as they have been published by ACOG and SM[facility]. The patient has been informed that there have not been any documented vaccine related injuries, deaths or birth defects to infant or mom after receiving the COVID-19 vaccine to date. The patient has been made aware that although she is not at increased risk of contracting COVID-19 during pregnancy, she is at increased risk of developing severe disease and complications if she contracts COVID-19 while pregnant. All patient questions were addressed during our visit today. The patient is still unsure of her decision for vaccination.    bill 92119 with 25 modifier for in-person and CR modifier  2. Supervision of other normal pregnancy, antepartum  Declines Panorama Reassured baby is doing well  3. Hyperthyroidism Followed by endocrinologist who will send labs  Preterm labor symptoms and general obstetric precautions including but not limited to vaginal bleeding, contractions, leaking of fluid and fetal movement were reviewed in detail with the patient. Please refer to After Visit Summary for other counseling recommendations.   Return in about 5 weeks (around 01/08/2020), or 5-6 weeks or as needed.  Future Appointments  Date Time Provider Department Center  01/01/2020  8:30 AM Aviva Signs, CNM [facility] None  01/16/2020  9:45 AM [facility] US[facility]    Wynelle Bourgeois, CNM

## 2019-12-04 NOTE — Patient Instructions (Signed)

## 2020-01-01 ENCOUNTER — Ambulatory Visit (INDEPENDENT_AMBULATORY_CARE_PROVIDER_SITE_OTHER): Payer: Medicaid Other | Admitting: Advanced Practice Midwife

## 2020-01-01 ENCOUNTER — Other Ambulatory Visit: Payer: Self-pay

## 2020-01-01 ENCOUNTER — Encounter: Payer: Self-pay | Admitting: Advanced Practice Midwife

## 2020-01-01 DIAGNOSIS — E0590 Thyrotoxicosis, unspecified without thyrotoxic crisis or storm: Secondary | ICD-10-CM

## 2020-01-01 DIAGNOSIS — Z3480 Encounter for supervision of other normal pregnancy, unspecified trimester: Secondary | ICD-10-CM

## 2020-01-01 NOTE — Progress Notes (Signed)
   PRENATAL VISIT NOTE  Subjective:  Bonnie Cross is a 29 y.o. G4P3003 at [redacted]w[redacted]d being seen today for ongoing prenatal care.  She is currently monitored for the following issues for this high-risk pregnancy and has Pelvic pain affecting pregnancy; Chronic neck pain; Supervision of other normal pregnancy, antepartum; Vitamin D deficiency; Polyarthralgia; COVID-19 affecting pregnancy in first trimester; and Hyperthyroidism on their problem list.  Patient reports no complaints.  Contractions: Not present. Vag. Bleeding: None.  Movement: Present. Denies leaking of fluid.   The following portions of the patient's history were reviewed and updated as appropriate: allergies, current medications, past family history, past medical history, past social history, past surgical history and problem list.   Objective:   Vitals:   01/01/20 0836  BP: (!) 104/55  Pulse: 78  Weight: 157 lb (71.2 kg)    Fetal Status: Fetal Heart Rate (bpm): 145 Fundal Height: 18 cm Movement: Present     General:  Alert, oriented and cooperative. Patient is in no acute distress.  Skin: Skin is warm and dry. No rash noted.   Cardiovascular: Normal heart rate noted  Respiratory: Normal respiratory effort, no problems with respiration noted  Abdomen: Soft, gravid, appropriate for gestational age.  Pain/Pressure: Absent     Pelvic: Cervical exam deferred        Extremities: Normal range of motion.  Edema: None  Mental Status: Normal mood and affect. Normal behavior. Normal judgment and thought content.   Assessment and Plan:  Pregnancy: G4P3003 at [redacted]w[redacted]d 1. Supervision of other normal pregnancy, antepartum Doing well.  No problems with N/V, constipation.  Eats vegetables and drinks water.   [place] scheduled for anatomy  2. Hyperthyroidism  Followed by Endocrinology Recommend bloodwork at least every Trimester  Preterm labor symptoms and general obstetric precautions including but not limited to vaginal bleeding,  contractions, leaking of fluid and fetal movement were reviewed in detail with the patient. Please refer to After Visit Summary for other counseling recommendations.   Return in about 4 weeks (around 01/29/2020) for [employer].  Future Appointments  Date Time Provider Department Center  01/16/2020  9:45 AM [facility] US[facility]    Wynelle Bourgeois, CNM

## 2020-01-01 NOTE — Patient Instructions (Signed)

## 2020-01-16 ENCOUNTER — Ambulatory Visit: Payer: Medicaid Other | Attending: Advanced Practice Midwife

## 2020-01-16 ENCOUNTER — Other Ambulatory Visit: Payer: Self-pay

## 2020-01-16 DIAGNOSIS — Z363 Encounter for antenatal screening for malformations: Secondary | ICD-10-CM | POA: Insufficient documentation

## 2020-01-16 DIAGNOSIS — Z3A09 9 weeks gestation of pregnancy: Secondary | ICD-10-CM | POA: Diagnosis not present

## 2020-01-16 DIAGNOSIS — Z3482 Encounter for supervision of other normal pregnancy, second trimester: Secondary | ICD-10-CM | POA: Diagnosis not present

## 2020-01-16 DIAGNOSIS — Z3480 Encounter for supervision of other normal pregnancy, unspecified trimester: Secondary | ICD-10-CM | POA: Diagnosis not present

## 2020-01-29 ENCOUNTER — Encounter: Payer: Self-pay | Admitting: Advanced Practice Midwife

## 2020-01-29 ENCOUNTER — Other Ambulatory Visit: Payer: Self-pay

## 2020-01-29 ENCOUNTER — Ambulatory Visit (INDEPENDENT_AMBULATORY_CARE_PROVIDER_SITE_OTHER): Payer: Medicaid Other | Admitting: Advanced Practice Midwife

## 2020-01-29 ENCOUNTER — Other Ambulatory Visit: Payer: Self-pay | Admitting: Advanced Practice Midwife

## 2020-01-29 VITALS — BP 108/68 | HR 90 | Wt 165.0 lb

## 2020-01-29 DIAGNOSIS — Z3480 Encounter for supervision of other normal pregnancy, unspecified trimester: Secondary | ICD-10-CM

## 2020-01-29 DIAGNOSIS — G4701 Insomnia due to medical condition: Secondary | ICD-10-CM

## 2020-01-29 DIAGNOSIS — O98511 Other viral diseases complicating pregnancy, first trimester: Secondary | ICD-10-CM

## 2020-01-29 DIAGNOSIS — E0590 Thyrotoxicosis, unspecified without thyrotoxic crisis or storm: Secondary | ICD-10-CM

## 2020-01-29 DIAGNOSIS — R0981 Nasal congestion: Secondary | ICD-10-CM

## 2020-01-29 DIAGNOSIS — G2581 Restless legs syndrome: Secondary | ICD-10-CM | POA: Insufficient documentation

## 2020-01-29 DIAGNOSIS — U071 COVID-19: Secondary | ICD-10-CM

## 2020-01-29 MED ORDER — HYDROXYZINE HCL 25 MG PO TABS
25.0000 mg | ORAL_TABLET | Freq: Every evening | ORAL | 2 refills | Status: DC | PRN
Start: 2020-01-29 — End: 2020-04-11

## 2020-01-29 MED ORDER — FLUTICASONE PROPIONATE 50 MCG/ACT NA SUSP
1.0000 | Freq: Every day | NASAL | 2 refills | Status: DC
Start: 2020-01-29 — End: 2020-01-29

## 2020-01-29 MED FILL — FLUTICASONE PROP 50 MCG SPR: 50 | 60 days supply | Qty: 16 | Fill #0

## 2020-01-29 MED FILL — hydrOXYzine HCL 25 MG TABS: 25 | 30 days supply | Qty: 30 | Fill #0

## 2020-01-29 NOTE — Progress Notes (Signed)
   PRENATAL VISIT NOTE  Subjective:  Bonnie Cross is a 29 y.o. G4P3003 at [redacted]w[redacted]d being seen today for ongoing prenatal care.  She is currently monitored for the following issues for this high-risk pregnancy and has Pelvic pain affecting pregnancy; Chronic neck pain; Supervision of other normal pregnancy, antepartum; Vitamin D deficiency; Polyarthralgia; COVID-19 affecting pregnancy in first trimester; and Hyperthyroidism on their problem list.  Patient reports some hair loss, feeling cold.  Wants TSH checked. Her endocrinologist told her he would not do it for 3 more months.  . Also having restless legs at night, as well as nasal congestion. Sometimes wakes up "feels like I can't breathe".   Contractions: Not present. Vag. Bleeding: None.  Movement: Present. Denies leaking of fluid.   The following portions of the patient's history were reviewed and updated as appropriate: allergies, current medications, past family history, past medical history, past social history, past surgical history and problem list.   Objective:   Vitals:   01/29/20 0835  BP: 108/68  Pulse: 90  Weight: 165 lb (74.8 kg)    Fetal Status: Fetal Heart Rate (bpm): 140   Movement: Present     General:  Alert, oriented and cooperative. Patient is in no acute distress.  Skin: Skin is warm and dry. No rash noted.   Cardiovascular: Normal heart rate noted  Respiratory: Normal respiratory effort, no problems with respiration noted  Abdomen: Soft, gravid, appropriate for gestational age.  Pain/Pressure: Absent     Pelvic: Cervical exam deferred        Extremities: Normal range of motion.  Edema: None  Mental Status: Normal mood and affect. Normal behavior. Normal judgment and thought content.   Assessment and Plan:  Pregnancy: G4P3003 at [redacted]w[redacted]d 1. COVID-19 affecting pregnancy in first trimester     Recovered  2. Supervision of other normal pregnancy, antepartum      [place] was normal   3. Hyperthyroidism       Will  check labs today. F/u with Endo re: results - TSH - T3 - T4, free  4.  Nasal congestion      Rx Flonase at night, then use saline in am  5.  Restless legs      Will try Vistaril qhs      May need referral to sleep specialist   Preterm labor symptoms and general obstetric precautions including but not limited to vaginal bleeding, contractions, leaking of fluid and fetal movement were reviewed in detail with the patient. Please refer to After Visit Summary for other counseling recommendations.   Return in about 6 weeks (around 03/11/2020) for [facility].  Future Appointments  Date Time Provider Department Center  03/11/2020  8:30 AM Aviva Signs, CNM [facility] None    Wynelle Bourgeois, CNM

## 2020-01-29 NOTE — Patient Instructions (Signed)

## 2020-01-30 LAB — T3: T3, Total: 169 ng/dL (ref 71–180)

## 2020-01-30 LAB — TSH: TSH: 1.42 u[IU]/mL (ref 0.450–4.500)

## 2020-01-30 LAB — T4, FREE: Free T4: 0.96 ng/dL (ref 0.82–1.77)

## 2020-02-04 ENCOUNTER — Other Ambulatory Visit: Payer: Self-pay | Admitting: Obstetrics & Gynecology

## 2020-02-04 ENCOUNTER — Other Ambulatory Visit: Payer: Self-pay

## 2020-02-04 ENCOUNTER — Ambulatory Visit (INDEPENDENT_AMBULATORY_CARE_PROVIDER_SITE_OTHER): Payer: Medicaid Other

## 2020-02-04 DIAGNOSIS — R310 Gross hematuria: Secondary | ICD-10-CM

## 2020-02-04 DIAGNOSIS — R300 Dysuria: Secondary | ICD-10-CM

## 2020-02-04 LAB — POCT URINALYSIS DIPSTICK
Bilirubin, UA: NEGATIVE
Glucose, UA: NEGATIVE
Ketones, UA: NEGATIVE
Nitrite, UA: NEGATIVE
Protein, UA: NEGATIVE
Urobilinogen, UA: 0.2 E.U./dL

## 2020-02-04 MED ORDER — CEPHALEXIN 500 MG PO CAPS
500.0000 mg | ORAL_CAPSULE | Freq: Four times a day (QID) | ORAL | 0 refills | Status: DC
Start: 2020-02-04 — End: 2020-04-11

## 2020-02-04 MED FILL — CEPHALEXIN 500 MG CAPSULE: 500 | 5 days supply | Qty: 20 | Fill #0

## 2020-02-04 NOTE — Progress Notes (Signed)
Patient presents for urine dip for dysuria. Patient is [redacted] week pregnant.Patient fetal heart tone obtained: 145bpm. Reviewed patient case with Dr.  Erin Fulling who states we will culture the urine and begin treatment with Keflex. Armandina Stammer RN

## 2020-02-06 LAB — CULTURE, OB URINE

## 2020-02-06 LAB — URINE CULTURE, OB REFLEX

## 2020-02-23 NOTE — L&D Delivery Note (Addendum)
CNM called to bedside. Patinet SROM with thick particulate meconium. Cervix now 8cm/100/0. Patient quickly progressed to complete and involuntarily pushing.  Delivery Note At 8:08 AM a viable female was delivered via Vaginal, Spontaneous (Presentation: Left Occiput Anterior).  APGAR: 8, 9; weight pending.   Placenta status: Spontaneous, Intact.  Cord: 3 vessels with the following complications: None.    Anesthesia: local lidocaine 10cc Episiotomy: None Lacerations: 1st degree perineal Suture Repair: 3.0 vicryl Est. Blood Loss (mL): 150  Called to bedside after infant out and on maternal chest. Care assumed from [facility]. Placenta delivered without difficulty. Laceration repaired as noted above.  Alric Seton, MD OB Fellow, Faculty [facility], Center for [facility] 06/01/2020 8:42 AM

## 2020-03-11 ENCOUNTER — Encounter: Payer: Medicaid Other | Admitting: Advanced Practice Midwife

## 2020-03-12 ENCOUNTER — Ambulatory Visit: Payer: Medicaid Other

## 2020-03-17 ENCOUNTER — Other Ambulatory Visit: Payer: Self-pay

## 2020-03-17 ENCOUNTER — Ambulatory Visit (INDEPENDENT_AMBULATORY_CARE_PROVIDER_SITE_OTHER): Payer: Medicaid Other

## 2020-03-17 DIAGNOSIS — Z3480 Encounter for supervision of other normal pregnancy, unspecified trimester: Secondary | ICD-10-CM

## 2020-03-17 DIAGNOSIS — Z3483 Encounter for supervision of other normal pregnancy, third trimester: Secondary | ICD-10-CM

## 2020-03-17 DIAGNOSIS — Z3A28 28 weeks gestation of pregnancy: Secondary | ICD-10-CM

## 2020-03-17 NOTE — Progress Notes (Signed)
Patient sent to lab for 28 week lab work.  Patient blood type O+

## 2020-03-18 LAB — CBC
Hematocrit: 29.7 % — ABNORMAL LOW (ref 34.0–46.6)
Hemoglobin: 9.6 g/dL — ABNORMAL LOW (ref 11.1–15.9)
MCH: 28.2 pg (ref 26.6–33.0)
MCHC: 32.3 g/dL (ref 31.5–35.7)
MCV: 87 fL (ref 79–97)
Platelets: 279 10*3/uL (ref 150–450)
RBC: 3.41 x10E6/uL — ABNORMAL LOW (ref 3.77–5.28)
RDW: 13.2 % (ref 11.7–15.4)
WBC: 7.8 10*3/uL (ref 3.4–10.8)

## 2020-03-18 LAB — GLUCOSE TOLERANCE, 2 HOURS W/ 1HR
Glucose, 1 hour: 159 mg/dL (ref 65–179)
Glucose, 2 hour: 105 mg/dL (ref 65–152)
Glucose, Fasting: 82 mg/dL (ref 65–91)

## 2020-03-18 LAB — RPR: RPR Ser Ql: NONREACTIVE

## 2020-03-18 LAB — HIV ANTIBODY (ROUTINE TESTING W REFLEX): HIV Screen 4th Generation wRfx: NONREACTIVE

## 2020-03-19 ENCOUNTER — Encounter: Payer: Self-pay | Admitting: Obstetrics & Gynecology

## 2020-03-19 ENCOUNTER — Other Ambulatory Visit: Payer: Self-pay | Admitting: Obstetrics & Gynecology

## 2020-03-25 ENCOUNTER — Other Ambulatory Visit: Payer: Self-pay

## 2020-03-25 ENCOUNTER — Other Ambulatory Visit: Payer: Self-pay | Admitting: Advanced Practice Midwife

## 2020-03-25 ENCOUNTER — Ambulatory Visit (INDEPENDENT_AMBULATORY_CARE_PROVIDER_SITE_OTHER): Payer: Medicaid Other | Admitting: Advanced Practice Midwife

## 2020-03-25 ENCOUNTER — Encounter: Payer: Self-pay | Admitting: Advanced Practice Midwife

## 2020-03-25 VITALS — BP 102/58 | HR 88 | Wt 173.0 lb

## 2020-03-25 DIAGNOSIS — L853 Xerosis cutis: Secondary | ICD-10-CM

## 2020-03-25 DIAGNOSIS — Z3A29 29 weeks gestation of pregnancy: Secondary | ICD-10-CM

## 2020-03-25 DIAGNOSIS — G2581 Restless legs syndrome: Secondary | ICD-10-CM

## 2020-03-25 DIAGNOSIS — G4701 Insomnia due to medical condition: Secondary | ICD-10-CM

## 2020-03-25 MED ORDER — FERROUS SULFATE 325 (65 FE) MG PO TABS: 325.0000 mg | ORAL_TABLET | Freq: Every day | ORAL | 2 refills | Status: DC

## 2020-03-25 MED FILL — FERROUS SULFATE 325 MG TAB: 325 (65 FE) | 100 days supply | Qty: 100 | Fill #0

## 2020-03-25 NOTE — Patient Instructions (Signed)

## 2020-03-25 NOTE — Progress Notes (Signed)
   PRENATAL VISIT NOTE  Subjective:  Bonnie Cross is a 30 y.o. G4P3003 at [redacted]w[redacted]d being seen today for ongoing prenatal care.  She is currently monitored for the following issues for this high-risk pregnancy and has Pelvic pain affecting pregnancy; Anemia, antepartum; Chronic neck pain; Supervision of other normal pregnancy, antepartum; Vitamin D deficiency; Polyarthralgia; COVID-19 affecting pregnancy in first trimester; Hyperthyroidism; and Insomnia secondary to restless leg syndrome on their problem list.  Patient reports dry skin, restless legs not better with Vistaril.  Contractions: Not present. Vag. Bleeding: None.  Movement: Present. Denies leaking of fluid.   The following portions of the patient's history were reviewed and updated as appropriate: allergies, current medications, past family history, past medical history, past social history, past surgical history and problem list.   Objective:   Vitals:   03/25/20 0912  BP: (!) 102/58  Pulse: 88  Weight: 173 lb (78.5 kg)    Fetal Status: Fetal Heart Rate (bpm): 125   Movement: Present     General:  Alert, oriented and cooperative. Patient is in no acute distress.  Skin: Skin is warm and dry. No rash noted.   Cardiovascular: Normal heart rate noted  Respiratory: Normal respiratory effort, no problems with respiration noted  Abdomen: Soft, gravid, appropriate for gestational age.  Pain/Pressure: Present     Pelvic: Cervical exam deferred        Extremities: Normal range of motion.  Edema: None  Mental Status: Normal mood and affect. Normal behavior. Normal judgment and thought content.   Assessment and Plan:  Pregnancy: G4P3003 at [redacted]w[redacted]d 1. 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 Dry skin Discussed emollients, oil is best in winter  3. [redacted] weeks gestation of pregnancy     Glucola normal   Preterm labor symptoms and general obstetric precautions including but not limited to vaginal  bleeding, contractions, leaking of fluid and fetal movement were reviewed in detail with the patient. Please refer to After Visit Summary for other counseling recommendations.   Return in about 2 weeks (around 04/08/2020) for [facility].  Future Appointments  Date Time Provider Department Center  04/08/2020  9:00 AM Aviva Signs, CNM [facility] None  04/22/2020  8:15 AM Sharyon Cable, CNM [facility] None    Wynelle Bourgeois, CNM

## 2020-04-08 ENCOUNTER — Encounter: Payer: Self-pay | Admitting: Advanced Practice Midwife

## 2020-04-08 ENCOUNTER — Ambulatory Visit (INDEPENDENT_AMBULATORY_CARE_PROVIDER_SITE_OTHER): Payer: Medicaid Other | Admitting: Advanced Practice Midwife

## 2020-04-08 ENCOUNTER — Other Ambulatory Visit: Payer: Self-pay

## 2020-04-08 VITALS — BP 109/73 | HR 109 | Wt 174.0 lb

## 2020-04-08 DIAGNOSIS — E0590 Thyrotoxicosis, unspecified without thyrotoxic crisis or storm: Secondary | ICD-10-CM

## 2020-04-08 DIAGNOSIS — O98511 Other viral diseases complicating pregnancy, first trimester: Secondary | ICD-10-CM

## 2020-04-08 DIAGNOSIS — R000 Tachycardia, unspecified: Secondary | ICD-10-CM | POA: Insufficient documentation

## 2020-04-08 DIAGNOSIS — U071 COVID-19: Secondary | ICD-10-CM

## 2020-04-08 DIAGNOSIS — Z3480 Encounter for supervision of other normal pregnancy, unspecified trimester: Secondary | ICD-10-CM

## 2020-04-08 DIAGNOSIS — R42 Dizziness and giddiness: Secondary | ICD-10-CM

## 2020-04-08 DIAGNOSIS — Z3A31 31 weeks gestation of pregnancy: Secondary | ICD-10-CM

## 2020-04-08 NOTE — Progress Notes (Signed)
   PRENATAL VISIT NOTE  Subjective:  Bonnie Cross is a 30 y.o. G4P3003 at [redacted]w[redacted]d being seen today for ongoing prenatal care.  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  Patient reports daily episodes of fast heartrate with dizziness..  Contractions: Not present. Vag. Bleeding: None.  Movement: Present. Denies leaking of fluid.   The following portions of the patient's history were reviewed and updated as appropriate: allergies, current medications, past family history, past medical history, past social history, past surgical history and problem list.   Objective:   Vitals:   04/08/20 0903  BP: 109/73  Pulse: (!) 109  Weight: 174 lb (78.9 kg)    Fetal Status: Fetal Heart Rate (bpm): 130 Fundal Height: 31 cm Movement: Present     General:  Alert, oriented and cooperative. Patient is in no acute distress.  Skin: Skin is warm and dry. No rash noted.   Cardiovascular: Normal heart rate noted  Respiratory: Normal respiratory effort, no problems with respiration noted  Abdomen: Soft, gravid, appropriate for gestational age.  Pain/Pressure: Present     Pelvic: Cervical exam deferred        Extremities: Normal range of motion.  Edema: None  Mental Status: Normal mood and affect. Normal behavior. Normal judgment and thought content.   Assessment and Plan:  Pregnancy: G4P3003 at [redacted]w[redacted]d 1. COVID-19 affecting pregnancy in first trimester     Resolved  2. Supervision of other normal pregnancy, antepartum      Glucola normal  3. [redacted] weeks gestation of pregnancy   4. Tachycardia, unspecified       Will recheck Thyroid labs and  refer to Cardiology       Discussed vagal maneuvers to try in the meantime - TSH - T3 - T4, free - Ambulatory referral to Cardiology  5. Dizzy spells     03/17/20 Hgb 9.6     Taking oral iron - TSH - T3 - T4, free - Ambulatory referral to Cardiology  6. Hyperthyroidism  - TSH - T3 - T4, free - Ambulatory referral to Cardiology  Preterm labor symptoms and general obstetric precautions including but not limited to vaginal bleeding, contractions, leaking of fluid and fetal movement were reviewed in detail with the patient. Please refer to After Visit Summary for other counseling recommendations.    Future Appointments  Date Time Provider Department Center  04/11/2020  8:00 AM Thomasene Ripple, DO [facility] None  04/22/2020  8:15 AM Sharyon Cable, CNM [facility] None    Wynelle Bourgeois, CNM

## 2020-04-08 NOTE — Patient Instructions (Signed)

## 2020-04-08 NOTE — Progress Notes (Signed)
Patient complaining of high heart rate and dizziness.Patient states she had an episode recently where she was at the store and her heart rate was 130 bpm and she felt faint. Armandina Stammer RN

## 2020-04-09 LAB — TSH: TSH: 0.988 u[IU]/mL (ref 0.450–4.500)

## 2020-04-09 LAB — T4, FREE: Free T4: 0.94 ng/dL (ref 0.82–1.77)

## 2020-04-09 LAB — T3: T3, Total: 158 ng/dL (ref 71–180)

## 2020-04-11 ENCOUNTER — Other Ambulatory Visit: Payer: Self-pay

## 2020-04-11 ENCOUNTER — Encounter: Payer: Self-pay | Admitting: Cardiology

## 2020-04-11 ENCOUNTER — Ambulatory Visit (INDEPENDENT_AMBULATORY_CARE_PROVIDER_SITE_OTHER): Payer: Medicaid Other

## 2020-04-11 ENCOUNTER — Ambulatory Visit (INDEPENDENT_AMBULATORY_CARE_PROVIDER_SITE_OTHER): Payer: Medicaid Other | Admitting: Cardiology

## 2020-04-11 VITALS — BP 100/58 | HR 95 | Ht 63.0 in | Wt 174.0 lb

## 2020-04-11 DIAGNOSIS — I38 Endocarditis, valve unspecified: Secondary | ICD-10-CM

## 2020-04-11 DIAGNOSIS — R42 Dizziness and giddiness: Secondary | ICD-10-CM | POA: Diagnosis not present

## 2020-04-11 DIAGNOSIS — R002 Palpitations: Secondary | ICD-10-CM | POA: Diagnosis not present

## 2020-04-11 DIAGNOSIS — R55 Syncope and collapse: Secondary | ICD-10-CM | POA: Diagnosis not present

## 2020-04-11 NOTE — Patient Instructions (Signed)
Medication Instructions:  Your physician recommends that you continue on your current medications as directed. Please refer to the Current Medication list given to you today.  *If you need a refill on your cardiac medications before your next appointment, please call your pharmacy*   Lab Work: Your physician recommends that you return for lab work: TODAY BMET, Mag, CBC  If you have labs (blood work) drawn today and your tests are completely normal, you will receive your results only by: Marland Kitchen MyChart Message (if you have MyChart) OR . A paper copy in the mail If you have any lab test that is abnormal or we need to change your treatment, we will call you to review the results.   Testing/Procedures: Your physician has requested that you have an echocardiogram. Echocardiography is a painless test that uses sound waves to create images of your heart. It provides your doctor with information about the size and shape of your heart and how well your heart's chambers and valves are working. This procedure takes approximately one hour. There are no restrictions for this procedure.  A zio monitor was ordered today. It will remain on for 14 days. You will then return monitor and event diary in provided box. It takes 1-2 weeks for report to be downloaded and returned to [place]. We will call you with the results. If monitor falls off or has orange flashing light, please call Zio for further instructions.     Follow-Up: At [facility], you and your health needs are our priority.  As part of our continuing mission to provide you with exceptional heart care, we have created designated Provider Care Teams.  These Care Teams include your primary Cardiologist (physician) and Advanced Practice Providers (APPs -  Physician Assistants and Nurse Practitioners) who all work together to provide you with the care you need, when you need it.  We recommend signing up for the patient portal called "MyChart".  Sign up  information is provided on this After Visit Summary.  MyChart is used to connect with patients for Virtual Visits (Telemedicine).  Patients are able to view lab/test results, encounter notes, upcoming appointments, etc.  Non-urgent messages can be sent to your provider as well.   To learn more about what you can do with MyChart, go to [email or web address].    Your next appointment:   4 week(s)  The format for your next appointment:   In Person  Provider:   Thomasene Ripple, DO   Other Instructions

## 2020-04-11 NOTE — Progress Notes (Signed)
Cardiology Office Note:    Date:  04/11/2020   ID:  Bonnie Cross, DOB 1990-09-18, MRN [id number]  PCP:  Patient, No Pcp Per  Cardiologist:  Thomasene Ripple, DO  Electrophysiologist:  None   Referring MD: Aviva Signs, CNM   Chief Complaint  Patient presents with  . Tachycardia    History of Present Illness:    Bonnie Cross is a 30 y.o. female with history of COVID-19 infection, vitamin D deficiency, insomnia secondary to restless leg syndrome was referred by her obstetrics team to be evaluated for palpitation and presyncope. She is a [id number] and is currently 31 weeks of gestation.   The patient tells me that recently she has been monitor on her apple watch that her heart rate has gotten worse than it was at first she was in the low 100s but recently she has been going up to the 130s and 140s when this happened she tells me she gets significantly lightheadedness and feels as if her chest is heavy.  She notes that every time this occurs when she stands she feels as if she is going to pass out she has to sit down a few minutes and resolves.  She had a incident where she was out shopping with her 54-year-old daughter and noticed significant palpitations lightheadedness she had to quickly walk out to her car to sit down and eventually this resolved.  She has had some shortness of breath but the presyncope episode is worse.  Thankfully she has not actually passed out.  No other complaints at this time.  Past Medical History:  Diagnosis Date  . Medical history non-contributory     Past Surgical History:  Procedure Laterality Date  . NOSE SURGERY      Current Medications: Current Meds  Medication Sig  . Ascorbic Acid (VITAMIN C PO) Take 1 tablet by mouth daily.  . ferrous sulfate 325 (65 FE) MG tablet Take 1 tablet (325 mg total) by mouth daily with breakfast.  . fluticasone (FLONASE) 50 MCG/ACT nasal spray Place 1 spray into both nostrils daily.  . Inulin (FIBER CHOICE PO) Take  1 tablet by mouth daily as needed (Constipation).  . Prenatal Vit-Fe Fumarate-FA (MULTIVITAMIN-PRENATAL) 27-0.8 MG TABS tablet Take 1 tablet by mouth daily at 12 noon.   Marland Kitchen VITAMIN D, CHOLECALCIFEROL, PO Take 1 tablet by mouth daily.     Allergies:   Feraheme [ferumoxytol]   Social History   Socioeconomic History  . Marital status: Single    Spouse name: Not on file  . Number of children: Not on file  . Years of education: Not on file  . Highest education level: Not on file  Occupational History  . Not on file  Tobacco Use  . Smoking status: Never Smoker  . Smokeless tobacco: Never Used  Vaping Use  . Vaping Use: Never used  Substance and Sexual Activity  . Alcohol use: Never  . Drug use: Never  . Sexual activity: Yes    Birth control/protection: None  Other Topics Concern  . Not on file  Social History Narrative  . Not on file   Social Determinants of Health   Financial Resource Strain: Not on file  Food Insecurity: Not on file  Transportation Needs: Not on file  Physical Activity: Not on file  Stress: Not on file  Social Connections: Not on file     Family History: The patient's family history includes Cancer in her paternal uncle; Healthy in her mother; Heart disease in  her father; Stroke in her father. There is no history of Diabetes or Hearing loss.  ROS:   Review of Systems  Constitution: Negative for decreased appetite, fever and weight gain.  HENT: Negative for congestion, ear discharge, hoarse voice and sore throat.   Eyes: Negative for discharge, redness, vision loss in right eye and visual halos.  Cardiovascular: Reports chest pressure, palpitations and presyncope.  Negative for chest pain, dyspnea on exertion, leg swelling, orthopnea. Respiratory: Negative for cough, hemoptysis, shortness of breath and snoring.   Endocrine: Negative for heat intolerance and polyphagia.  Hematologic/Lymphatic: Negative for bleeding problem. Does not bruise/bleed easily.   Skin: Negative for flushing, nail changes, rash and suspicious lesions.  Musculoskeletal: Negative for arthritis, joint pain, muscle cramps, myalgias, neck pain and stiffness.  Gastrointestinal: Negative for abdominal pain, bowel incontinence, diarrhea and excessive appetite.  Genitourinary: Negative for decreased libido, genital sores and incomplete emptying.  Neurological: Negative for brief paralysis, focal weakness, headaches and loss of balance.  Psychiatric/Behavioral: Negative for altered mental status, depression and suicidal ideas.  Allergic/Immunologic: Negative for HIV exposure and persistent infections.    EKGs/Labs/Other Studies Reviewed:    The following studies were reviewed today:   EKG:  The ekg ordered today demonstrates sinus rhythm, heart rate 95 bpm with RSR prime, compared to prior EKG done in 2019 at which time the patient was with sinus tachycardia.  Recent Labs: 03/17/2020: Hemoglobin 9.6; Platelets 279 04/08/2020: TSH 0.988  Recent Lipid Panel No results found for: CHOL, TRIG, HDL, CHOLHDL, VLDL, LDLCALC, LDLDIRECT  Physical Exam:    VS:  BP (!) 100/58 (BP Location: Left Arm, Patient Position: Sitting, Cuff Size: Normal)   Pulse 95   Ht 5\' 3"  (1.6 m)   Wt 174 lb (78.9 kg)   LMP 09/01/2019 (Exact Date)   SpO2 99%   BMI 30.82 kg/m     Wt Readings from Last 3 Encounters:  04/11/20 174 lb (78.9 kg)  04/08/20 174 lb (78.9 kg)  03/25/20 173 lb (78.5 kg)     GEN: Well nourished, well developed in no acute distress HEENT: Normal NECK: No JVD; No carotid bruits LYMPHATICS: No lymphadenopathy CARDIAC: S1S2 noted,RRR, 3/6 mixed holosystolic and low pitched diastolic murmurs, rubs, gallops RESPIRATORY:  Clear to auscultation without rales, wheezing or rhonchi  ABDOMEN: Soft, non-tender, non-distended, +bowel sounds, no guarding. EXTREMITIES: No edema, No cyanosis, no clubbing MUSCULOSKELETAL:  No deformity  SKIN: Warm and dry NEUROLOGIC:  Alert and  oriented x 3, non-focal PSYCHIATRIC:  Normal affect, good insight  ASSESSMENT:    1. Palpitations   2. Postural dizziness with presyncope   3. Diastolic murmur    PLAN:    Although sinus tachycardia is a normal physiologic pregnancy change, the patient now heart rate is going up to the 140s and she is experiencing significant episodes for presyncope in this young woman I like to make sure she is not experiencing any supraventricular tachyarrhythmias.  I will place a monitor her for 14 days to understand more of this.  For presyncope episode and the low pitched diastolic murmur I like to get an echocardiogram, to rule out any significant structural abnormality.  In the meantime, she has the main pregnancy and get a repeat her CBC and get BMP and magnesium.  Recent thyroid studies were normal.   The patient is in agreement with the above plan. The patient left the office in stable condition.  The patient will follow up in 4 weeks.   Medication Adjustments/Labs and  Tests Ordered: Current medicines are reviewed at length with the patient today.  Concerns regarding medicines are outlined above.  Orders Placed This Encounter  Procedures  . Basic metabolic panel  . CBC with Differential/Platelet  . Magnesium  . LONG TERM MONITOR (3-14 DAYS)  . EKG 12-Lead  . ECHOCARDIOGRAM COMPLETE   No orders of the defined types were placed in this encounter.   Patient Instructions  Medication Instructions:  Your physician recommends that you continue on your current medications as directed. Please refer to the Current Medication list given to you today.  *If you need a refill on your cardiac medications before your next appointment, please call your pharmacy*   Lab Work: Your physician recommends that you return for lab work: TODAY BMET, Mag, CBC  If you have labs (blood work) drawn today and your tests are completely normal, you will receive your results only by: Marland Kitchen MyChart Message (if you  have MyChart) OR . A paper copy in the mail If you have any lab test that is abnormal or we need to change your treatment, we will call you to review the results.   Testing/Procedures: Your physician has requested that you have an echocardiogram. Echocardiography is a painless test that uses sound waves to create images of your heart. It provides your doctor with information about the size and shape of your heart and how well your heart's chambers and valves are working. This procedure takes approximately one hour. There are no restrictions for this procedure.  A zio monitor was ordered today. It will remain on for 14 days. You will then return monitor and event diary in provided box. It takes 1-2 weeks for report to be downloaded and returned to [place]. We will call you with the results. If monitor falls off or has orange flashing light, please call Zio for further instructions.     Follow-Up: At [facility], you and your health needs are our priority.  As part of our continuing mission to provide you with exceptional heart care, we have created designated Provider Care Teams.  These Care Teams include your primary Cardiologist (physician) and Advanced Practice Providers (APPs -  Physician Assistants and Nurse Practitioners) who all work together to provide you with the care you need, when you need it.  We recommend signing up for the patient portal called "MyChart".  Sign up information is provided on this After Visit Summary.  MyChart is used to connect with patients for Virtual Visits (Telemedicine).  Patients are able to view lab/test results, encounter notes, upcoming appointments, etc.  Non-urgent messages can be sent to your provider as well.   To learn more about what you can do with MyChart, go to [email or web address].    Your next appointment:   4 week(s)  The format for your next appointment:   In Person  Provider:   Thomasene Ripple, DO   Other Instructions      Adopting a  Healthy Lifestyle.  Know what a healthy weight is for you (roughly BMI <25) and aim to maintain this   Aim for 7+ servings of fruits and vegetables daily   65-80+ fluid ounces of water or unsweet tea for healthy kidneys   Limit to max 1 drink of alcohol per day; avoid smoking/tobacco   Limit animal fats in diet for cholesterol and heart health - choose grass fed whenever available   Avoid highly processed foods, and foods high in saturated/trans fats   Aim for low stress - take  time to unwind and care for your mental health   Aim for 150 min of moderate intensity exercise weekly for heart health, and weights twice weekly for bone health   Aim for 7-9 hours of sleep daily   When it comes to diets, agreement about the perfect plan isnt easy to find, even among the experts. Experts at the [facility] [employer] developed an idea known as the Healthy Eating Plate. Just imagine a plate divided into logical, healthy portions.   The emphasis is on diet quality:   Load up on vegetables and fruits - one-half of your plate: Aim for color and variety, and remember that potatoes dont count.   Go for whole grains - one-quarter of your plate: Whole wheat, barley, wheat berries, quinoa, oats, brown rice, and foods made with them. If you want pasta, go with whole wheat pasta.   Protein power - one-quarter of your plate: Fish, chicken, beans, and nuts are all healthy, versatile protein sources. Limit red meat.   The diet, however, does go beyond the plate, offering a few other suggestions.   Use healthy plant oils, such as olive, canola, soy, corn, sunflower and peanut. Check the labels, and avoid partially hydrogenated oil, which have unhealthy trans fats.   If youre thirsty, drink water. Coffee and tea are good in moderation, but skip sugary drinks and limit milk and dairy products to one or two daily servings.   The type of carbohydrate in the diet is more important than the  amount. Some sources of carbohydrates, such as vegetables, fruits, whole grains, and beans-are healthier than others.   Finally, stay active  Signed, Thomasene Ripple, DO  04/11/2020 9:19 AM    [facility]

## 2020-04-12 LAB — CBC WITH DIFFERENTIAL/PLATELET
Basophils Absolute: 0.1 10*3/uL (ref 0.0–0.2)
Basos: 1 %
EOS (ABSOLUTE): 0.1 10*3/uL (ref 0.0–0.4)
Eos: 1 %
Hematocrit: 31.7 % — ABNORMAL LOW (ref 34.0–46.6)
Hemoglobin: 10 g/dL — ABNORMAL LOW (ref 11.1–15.9)
Immature Grans (Abs): 0.1 10*3/uL (ref 0.0–0.1)
Immature Granulocytes: 1 %
Lymphocytes Absolute: 1.5 10*3/uL (ref 0.7–3.1)
Lymphs: 21 %
MCH: 27.2 pg (ref 26.6–33.0)
MCHC: 31.5 g/dL (ref 31.5–35.7)
MCV: 86 fL (ref 79–97)
Monocytes Absolute: 0.5 10*3/uL (ref 0.1–0.9)
Monocytes: 8 %
Neutrophils Absolute: 4.8 10*3/uL (ref 1.4–7.0)
Neutrophils: 68 %
Platelets: 235 10*3/uL (ref 150–450)
RBC: 3.67 x10E6/uL — ABNORMAL LOW (ref 3.77–5.28)
RDW: 14 % (ref 11.7–15.4)
WBC: 7 10*3/uL (ref 3.4–10.8)

## 2020-04-12 LAB — BASIC METABOLIC PANEL
BUN/Creatinine Ratio: 14 (ref 9–23)
BUN: 6 mg/dL (ref 6–20)
CO2: 17 mmol/L — ABNORMAL LOW (ref 20–29)
Calcium: 8.7 mg/dL (ref 8.7–10.2)
Chloride: 104 mmol/L (ref 96–106)
Creatinine, Ser: 0.44 mg/dL — ABNORMAL LOW (ref 0.57–1.00)
GFR calc Af Amer: 158 mL/min/{1.73_m2} (ref >59)
GFR calc non Af Amer: 137 mL/min/{1.73_m2} (ref >59)
Glucose: 93 mg/dL (ref 65–99)
Potassium: 3.9 mmol/L (ref 3.5–5.2)
Sodium: 136 mmol/L (ref 134–144)

## 2020-04-12 LAB — MAGNESIUM: Magnesium: 1.6 mg/dL (ref 1.6–2.3)

## 2020-04-14 ENCOUNTER — Other Ambulatory Visit: Payer: Self-pay | Admitting: Obstetrics & Gynecology

## 2020-04-14 ENCOUNTER — Ambulatory Visit (INDEPENDENT_AMBULATORY_CARE_PROVIDER_SITE_OTHER): Payer: Medicaid Other

## 2020-04-14 ENCOUNTER — Other Ambulatory Visit: Payer: Self-pay

## 2020-04-14 ENCOUNTER — Telehealth: Payer: Self-pay

## 2020-04-14 VITALS — BP 101/81 | HR 116 | Wt 176.0 lb

## 2020-04-14 DIAGNOSIS — R399 Unspecified symptoms and signs involving the genitourinary system: Secondary | ICD-10-CM | POA: Diagnosis not present

## 2020-04-14 LAB — POCT URINALYSIS DIPSTICK OB
Bilirubin, UA: NEGATIVE
Glucose, UA: NEGATIVE
Ketones, UA: NEGATIVE
POC,PROTEIN,UA: NEGATIVE
Spec Grav, UA: 1.02 (ref 1.010–1.025)
Urobilinogen, UA: 0.2 E.U./dL
pH, UA: 6 (ref 5.0–8.0)

## 2020-04-14 MED ORDER — NITROFURANTOIN MONOHYD MACRO 100 MG PO CAPS: 100.0000 mg | ORAL_CAPSULE | Freq: Two times a day (BID) | ORAL | 0 refills | Status: DC

## 2020-04-14 MED FILL — NITROFURANTOIN MONO-MCR 100: 100 | 7 days supply | Qty: 14 | Fill #0

## 2020-04-14 NOTE — Telephone Encounter (Signed)
Pt called c/o dysuria, and blood in urine. Pt denies vaginal bleeding or leakage of fluid. Pt states this feels like a UTI. Pt scheduled to come into office for UA and possible culture.

## 2020-04-14 NOTE — Progress Notes (Addendum)
SUBJECTIVE: Bonnie Cross is a 30 y.o. female who complains of urinary frequency, urgency, hematuria, and dysuria x 4 days.    OBJECTIVE: Appears well, in no apparent distress.  Vital signs are normal. Urine dipstick shows positive for leukocytes and blood.    ASSESSMENT: Dysuria  PLAN: Macrobid 100 mg PO BID x 7 days was sent to pt's pharmacy. Urine culture was sent to the lab.  Attestation of Attending Supervision of CMA/RN: Evaluation and management procedures were performed by the nurse under my supervision and collaboration.  I have reviewed the nursing note and chart, and I agree with the management and plan.  Carolyn L. Harraway-Smith, M.D., [facility]

## 2020-04-16 LAB — CULTURE, OB URINE

## 2020-04-16 LAB — URINE CULTURE, OB REFLEX

## 2020-04-22 ENCOUNTER — Encounter: Payer: Self-pay | Admitting: Certified Nurse Midwife

## 2020-04-22 ENCOUNTER — Other Ambulatory Visit: Payer: Self-pay

## 2020-04-22 ENCOUNTER — Ambulatory Visit (INDEPENDENT_AMBULATORY_CARE_PROVIDER_SITE_OTHER): Payer: Medicaid Other | Admitting: Certified Nurse Midwife

## 2020-04-22 VITALS — BP 106/64 | HR 85 | Wt 176.0 lb

## 2020-04-22 DIAGNOSIS — E0590 Thyrotoxicosis, unspecified without thyrotoxic crisis or storm: Secondary | ICD-10-CM

## 2020-04-22 DIAGNOSIS — R000 Tachycardia, unspecified: Secondary | ICD-10-CM

## 2020-04-22 DIAGNOSIS — Z3480 Encounter for supervision of other normal pregnancy, unspecified trimester: Secondary | ICD-10-CM

## 2020-04-22 DIAGNOSIS — Z3A33 33 weeks gestation of pregnancy: Secondary | ICD-10-CM

## 2020-04-22 NOTE — Progress Notes (Signed)
   PRENATAL VISIT NOTE  Subjective:  Bonnie Cross is a 30 y.o. G4P3003 at 106w1d being seen today for ongoing prenatal care.  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  Patient reports no complaints.  Contractions: Not present. Vag. Bleeding: None.  Movement: Present. Denies leaking of fluid.   The following portions of the patient's history were reviewed and updated as appropriate: allergies, current medications, past family history, past medical history, past social history, past surgical history and problem list.   Objective:   Vitals:   04/22/20 0820  BP: 106/64  Pulse: 85  Weight: 176 lb (79.8 kg)    Fetal Status: Fetal Heart Rate (bpm): 130 Fundal Height: 32 cm Movement: Present     General:  Alert, oriented and cooperative. Patient is in no acute distress.  Skin: Skin is warm and dry. No rash noted.   Cardiovascular: Normal heart rate noted  Respiratory: Normal respiratory effort, no problems with respiration noted  Abdomen: Soft, gravid, appropriate for gestational age.  Pain/Pressure: Present     Pelvic: Cervical exam deferred        Extremities: Normal range of motion.  Edema: None  Mental Status: Normal mood and affect. Normal behavior. Normal judgment and thought content.   Assessment and Plan:  Pregnancy: G4P3003 at [redacted]w[redacted]d 1. Tachycardia, unspecified - patient is being followed and managed by cardiologist  - patient currently has heart monitor on for 14 days until March 4th, patient states that she has to send monitor back and then will be called with results and need for follow up  - Cardiologist  checked Magnesium, CBC and BMP which were stable   2. Supervision of other normal pregnancy, antepartum - Patient doing well, no complaints or concerns at this time  - Routine prenatal care  - Anticipatory guidance on upcoming appointments  - PTL precautions   3. Hyperthyroidism - TSH labs normal taken on 2/15  4. [redacted] weeks gestation of pregnancy - Educated and discussed GBS being completed at next prenatal visit, discussed recommendations of antibiotics during labor if GBS positive, patient verbalizes understanding   Preterm labor symptoms and general obstetric precautions including but not limited to vaginal bleeding, contractions, leaking of fluid and fetal movement were reviewed in detail with the patient. Please refer to After Visit Summary for other counseling recommendations.   Return in about 3 weeks (around 05/13/2020) for GBS, in person, HROB.  Future Appointments  Date Time Provider Department Center  05/13/2020 10:55 AM Aviva Signs, CNM [facility] None  05/14/2020 11:15 AM [facility]    Sharyon Cable, [place]

## 2020-04-22 NOTE — Patient Instructions (Signed)

## 2020-05-13 ENCOUNTER — Other Ambulatory Visit (HOSPITAL_COMMUNITY)
Admission: RE | Admit: 2020-05-13 | Discharge: 2020-05-13 | Disposition: A | Discharge status: Home / Self Care | Payer: Medicaid Other | Source: Ambulatory Visit | Attending: Advanced Practice Midwife | Admitting: Advanced Practice Midwife

## 2020-05-13 ENCOUNTER — Ambulatory Visit (INDEPENDENT_AMBULATORY_CARE_PROVIDER_SITE_OTHER): Payer: Medicaid Other | Admitting: Advanced Practice Midwife

## 2020-05-13 ENCOUNTER — Other Ambulatory Visit: Payer: Self-pay

## 2020-05-13 VITALS — BP 102/72 | HR 139 | Wt 176.0 lb

## 2020-05-13 DIAGNOSIS — R102 Pelvic and perineal pain: Secondary | ICD-10-CM | POA: Diagnosis not present

## 2020-05-13 DIAGNOSIS — O26893 Other specified pregnancy related conditions, third trimester: Secondary | ICD-10-CM | POA: Diagnosis present

## 2020-05-13 DIAGNOSIS — O99013 Anemia complicating pregnancy, third trimester: Secondary | ICD-10-CM | POA: Diagnosis not present

## 2020-05-13 DIAGNOSIS — Z3A36 36 weeks gestation of pregnancy: Secondary | ICD-10-CM

## 2020-05-13 DIAGNOSIS — E0590 Thyrotoxicosis, unspecified without thyrotoxic crisis or storm: Secondary | ICD-10-CM

## 2020-05-13 DIAGNOSIS — D649 Anemia, unspecified: Secondary | ICD-10-CM | POA: Diagnosis not present

## 2020-05-13 NOTE — Progress Notes (Signed)
   PRENATAL VISIT NOTE  Subjective:  Bonnie Cross is a 30 y.o. G4P3003 at [redacted]w[redacted]d being seen today for ongoing prenatal care.  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  Patient reports occasional contractions.  Contractions: Irregular. Vag. Bleeding: None.  Movement: Present. Denies leaking of fluid.   The following portions of the patient's history were reviewed and updated as appropriate: allergies, current medications, past family history, past medical history, past social history, past surgical history and problem list.   Objective:   Vitals:   05/13/20 1107  BP: 102/72  Pulse: (!) 139  Weight: 176 lb (79.8 kg)    Fetal Status: Fetal Heart Rate (bpm): 128   Movement: Present     General:  Alert, oriented and cooperative. Patient is in no acute distress.  Skin: Skin is warm and dry. No rash noted.   Cardiovascular: Normal heart rate noted  Respiratory: Normal respiratory effort, no problems with respiration noted  Abdomen: Soft, gravid, appropriate for gestational age.  Pain/Pressure: Present     Pelvic: Cervical exam performed in the presence of a chaperone      Cerrvix 1-2/50%/-3/Ballotable/vertex  Extremities: Normal range of motion.  Edema: None  Mental Status: Normal mood and affect. Normal behavior. Normal judgment and thought content.   Assessment and Plan:  Pregnancy: G4P3003 at [redacted]w[redacted]d 1. [redacted] weeks gestation of pregnancy  - GC/Chlamydia probe amp ([place])not at [facility] - Culture, beta strep (group b only)  2.   Hyperthyroidism Followed by Endocrinology Recheck TSH next visit  Preterm labor symptoms  and general obstetric precautions including but not limited to vaginal bleeding, contractions, leaking of fluid and fetal movement were reviewed in detail with the patient. Please refer to After Visit Summary for other counseling recommendations.     Future Appointments  Date Time Provider Department Center  05/14/2020 11:15 AM [facility]  05/20/2020  9:35 AM Aviva Signs, CNM [facility] None  05/27/2020  8:55 AM Rasch, Harolyn Rutherford, NP [facility] None  06/03/2020  8:55 AM Aviva Signs, CNM [facility] None    Wynelle Bourgeois, CNM

## 2020-05-14 ENCOUNTER — Ambulatory Visit (HOSPITAL_BASED_OUTPATIENT_CLINIC_OR_DEPARTMENT_OTHER)
Admission: RE | Admit: 2020-05-14 | Discharge: 2020-05-14 | Disposition: A | Discharge status: Home / Self Care | Payer: Medicaid Other | Source: Ambulatory Visit | Attending: Cardiology | Admitting: Cardiology

## 2020-05-14 DIAGNOSIS — R55 Syncope and collapse: Secondary | ICD-10-CM | POA: Diagnosis present

## 2020-05-14 DIAGNOSIS — R42 Dizziness and giddiness: Secondary | ICD-10-CM | POA: Insufficient documentation

## 2020-05-14 LAB — GC/CHLAMYDIA PROBE AMP (~~LOC~~) NOT AT ARMC
Chlamydia: NEGATIVE
Comment: NEGATIVE
Comment: NORMAL
Neisseria Gonorrhea: NEGATIVE

## 2020-05-14 LAB — ECHOCARDIOGRAM COMPLETE
Area-P 1/2: 5.02 cm2
S' Lateral: 2.37 cm

## 2020-05-20 ENCOUNTER — Ambulatory Visit (INDEPENDENT_AMBULATORY_CARE_PROVIDER_SITE_OTHER): Payer: Medicaid Other | Admitting: Advanced Practice Midwife

## 2020-05-20 ENCOUNTER — Other Ambulatory Visit: Payer: Self-pay

## 2020-05-20 ENCOUNTER — Encounter: Payer: Self-pay | Admitting: Advanced Practice Midwife

## 2020-05-20 VITALS — BP 105/62 | HR 81 | Wt 177.0 lb

## 2020-05-20 DIAGNOSIS — Z3A37 37 weeks gestation of pregnancy: Secondary | ICD-10-CM

## 2020-05-20 DIAGNOSIS — E0590 Thyrotoxicosis, unspecified without thyrotoxic crisis or storm: Secondary | ICD-10-CM

## 2020-05-20 NOTE — Progress Notes (Signed)
   PRENATAL VISIT NOTE  Subjective:  Bonnie Cross is a 30 y.o. G4P3003 at [redacted]w[redacted]d being seen today for ongoing prenatal care.  She is currently monitored for the following issues for this low-risk pregnancy and has Pelvic pain affecting pregnancy; Anemia, antepartum; Chronic neck pain; Supervision of other normal pregnancy, antepartum; Vitamin D deficiency; Polyarthralgia; COVID-19 affecting pregnancy in first trimester; Hyperthyroidism; Insomnia secondary to restless leg syndrome; and Tachycardia, unspecified on their problem list.  Patient reports occasional contractions.  Contractions: Irregular. Vag. Bleeding: None.  Movement: Present. Denies leaking of fluid.   The following portions of the patient's history were reviewed and updated as appropriate: allergies, current medications, past family history, past medical history, past social history, past surgical history and problem list.   Objective:   Vitals:   05/20/20 0935  BP: 105/62  Pulse: 81  Weight: 177 lb (80.3 kg)    Fetal Status: Fetal Heart Rate (bpm): 136   Movement: Present     General:  Alert, oriented and cooperative. Patient is in no acute distress.  Skin: Skin is warm and dry. No rash noted.   Cardiovascular: Normal heart rate noted  Respiratory: Normal respiratory effort, no problems with respiration noted  Abdomen: Soft, gravid, appropriate for gestational age.  Pain/Pressure: Absent     Pelvic: Cervical exam deferred        Extremities: Normal range of motion.  Edema: None  Mental Status: Normal mood and affect. Normal behavior. Normal judgment and thought content.   Assessment and Plan:  Pregnancy: G4P3003 at [redacted]w[redacted]d 1. [redacted] weeks gestation of pregnancy     Resent GBS (other sample not resulted) - TSH - Culture, beta strep (group b only)  2. Hyperthyroidism     Repeat TSH today  Term labor symptoms and general obstetric precautions including but not limited to vaginal bleeding, contractions, leaking of fluid  and fetal movement were reviewed in detail with the patient. Please refer to After Visit Summary for other counseling recommendations.   Return in about 1 week (around 05/27/2020) for [facility].  Future Appointments  Date Time Provider Department Center  05/27/2020  8:55 AM Rasch, Harolyn Rutherford, NP [facility] None  06/03/2020  8:55 AM Aviva Signs, CNM [facility] None    Wynelle Bourgeois, CNM

## 2020-05-20 NOTE — Patient Instructions (Signed)
Labor and Vaginal Delivery  Vaginal delivery means that you give birth by pushing your baby out of your birth canal (vagina). A team of health care providers will help you before, during, and after vaginal delivery. Birth experiences are unique for every woman and every pregnancy, and birth experiences vary depending on where you choose to give birth. What happens when I arrive at the birth center or hospital? Once you are in labor and have been admitted into the hospital or birth center, your health care provider may:  Review your pregnancy history and any concerns that you have.  Insert an IV into one of your veins. This may be used to give you fluids and medicines.  Check your blood pressure, pulse, temperature, and heart rate (vital signs).  Check whether your bag of water (amniotic sac) has broken (ruptured).  Talk with you about your birth plan and discuss pain control options. Monitoring Your health care provider may monitor your contractions (uterine monitoring) and your baby's heart rate (fetal monitoring). You may need to be monitored:  Often, but not continuously (intermittently).  All the time or for long periods at a time (continuously). Continuous monitoring may be needed if: ? You are taking certain medicines, such as medicine to relieve pain or make your contractions stronger. ? You have pregnancy or labor complications. Monitoring may be done by:  Placing a special stethoscope or a handheld monitoring device on your abdomen to check your baby's heartbeat and to check for contractions.  Placing monitors on your abdomen (external monitors) to record your baby's heartbeat and the frequency and length of contractions.  Placing monitors inside your uterus through your vagina (internal monitors) to record your baby's heartbeat and the frequency, length, and strength of your contractions. Depending on the type of monitor, it may remain in your uterus or on your baby's head  until birth.  Telemetry. This is a type of continuous monitoring that can be done with external or internal monitors. Instead of having to stay in bed, you are able to move around during telemetry. Physical exam Your health care provider may perform frequent physical exams. This may include:  Checking how and where your baby is positioned in your uterus.  Checking your cervix to determine: ? Whether it is thinning out (effacing). ? Whether it is opening up (dilating). What happens during labor and delivery? Normal labor and delivery is divided into the following three stages: Stage 1  This is the longest stage of labor.  This stage can last for hours or days.  Throughout this stage, you will feel contractions. Contractions generally feel mild, infrequent, and irregular at first. They get stronger, more frequent (about every 2-3 minutes), and more regular as you move through this stage.  This stage ends when your cervix is completely dilated to 4 inches (10 cm) and completely effaced. Stage 2  This stage starts once your cervix is completely effaced and dilated and lasts until the delivery of your baby.  This stage may last from 20 minutes to 2 hours.  This is the stage where you will feel an urge to push your baby out of your vagina.  You may feel stretching and burning pain, especially when the widest part of your baby's head passes through the vaginal opening (crowning).  Once your baby is delivered, the umbilical cord will be clamped and cut. This usually occurs after waiting a period of 1-2 minutes after delivery.  Your baby will be placed on your bare  chest (skin-to-skin contact) in an upright position and covered with a warm blanket. Watch your baby for feeding cues, like rooting or sucking, and help the baby to your breast for his or her first feeding. Stage 3  This stage starts immediately after the birth of your baby and ends after you deliver the placenta.  This stage  may take anywhere from 5 to 30 minutes.  After your baby has been delivered, you will feel contractions as your body expels the placenta and your uterus contracts to control bleeding.   What can I expect after labor and delivery?  After labor is over, you and your baby will be monitored closely until you are ready to go home to ensure that you are both healthy. Your health care team will teach you how to care for yourself and your baby.  You and your baby will stay in the same room (rooming in) during your hospital stay. This will encourage early bonding and successful breastfeeding.  You may continue to receive fluids and medicines through an IV.  Your uterus will be checked and massaged regularly (fundal massage).  You will have some soreness and pain in your abdomen, vagina, and the area of skin between your vaginal opening and your anus (perineum).  If an incision was made near your vagina (episiotomy) or if you had some vaginal tearing during delivery, cold compresses may be placed on your episiotomy or your tear. This helps to reduce pain and swelling.  You may be given a squirt bottle to use instead of wiping when you go to the bathroom. To use the squirt bottle, follow these steps: ? Before you urinate, fill the squirt bottle with warm water. Do not use hot water. ? After you urinate, while you are sitting on the toilet, use the squirt bottle to rinse the area around your urethra and vaginal opening. This rinses away any urine and blood. ? Fill the squirt bottle with clean water every time you use the bathroom.  It is normal to have vaginal bleeding after delivery. Wear a sanitary pad for vaginal bleeding and discharge. Summary  Vaginal delivery means that you will give birth by pushing your baby out of your birth canal (vagina).  Your health care provider may monitor your contractions (uterine monitoring) and your baby's heart rate (fetal monitoring).  Your health care provider  may perform a physical exam.  Normal labor and delivery is divided into three stages.  After labor is over, you and your baby will be monitored closely until you are ready to go home. This information is not intended to replace advice given to you by your health care provider. Make sure you discuss any questions you have with your health care provider. Document Revised: 03/15/2017 Document Reviewed: 03/15/2017 Elsevier Patient Education  2021 [employer].

## 2020-05-21 LAB — TSH: TSH: 1.25 u[IU]/mL (ref 0.450–4.500)

## 2020-05-24 LAB — CULTURE, BETA STREP (GROUP B ONLY): Strep Gp B Culture: NEGATIVE

## 2020-05-27 ENCOUNTER — Other Ambulatory Visit: Payer: Self-pay

## 2020-05-27 ENCOUNTER — Ambulatory Visit (INDEPENDENT_AMBULATORY_CARE_PROVIDER_SITE_OTHER): Payer: Medicaid Other | Admitting: Obstetrics and Gynecology

## 2020-05-27 DIAGNOSIS — Z3480 Encounter for supervision of other normal pregnancy, unspecified trimester: Secondary | ICD-10-CM

## 2020-05-27 NOTE — Patient Instructions (Signed)

## 2020-05-27 NOTE — Progress Notes (Signed)
   PRENATAL VISIT NOTE  Subjective:  Bonnie Cross is a 30 y.o. G4P3003 at [redacted]w[redacted]d being seen today for ongoing prenatal care.  She is currently monitored for the following issues for this low-risk pregnancy and has Pelvic pain affecting pregnancy; Anemia, antepartum; Chronic neck pain; Supervision of other normal pregnancy, antepartum; Vitamin D deficiency; Polyarthralgia; COVID-19 affecting pregnancy in first trimester; Hyperthyroidism; Insomnia secondary to restless leg syndrome; and Tachycardia, unspecified on their problem list.  Patient reports no complaints.  Contractions: Not present. Vag. Bleeding: None.  Movement: Present. Denies leaking of fluid.   The following portions of the patient's history were reviewed and updated as appropriate: allergies, current medications, past family history, past medical history, past social history, past surgical history and problem list.   Objective:   Vitals:   05/27/20 0908  BP: 101/65  Pulse: 80  Weight: 177 lb (80.3 kg)    Fetal Status: Fetal Heart Rate (bpm): 138 Fundal Height: 39 cm Movement: Present     General:  Alert, oriented and cooperative. Patient is in no acute distress.  Skin: Skin is warm and dry. No rash noted.   Cardiovascular: Normal heart rate noted  Respiratory: Normal respiratory effort, no problems with respiration noted  Abdomen: Soft, gravid, appropriate for gestational age.  Pain/Pressure: Absent     Pelvic: Cervical exam deferred        Extremities: Normal range of motion.  Edema: Trace  Mental Status: Normal mood and affect. Normal behavior. Normal judgment and thought content.   Assessment and Plan:  Pregnancy: G4P3003 at [redacted]w[redacted]d  1. Supervision of other normal pregnancy, antepartum  Reviewed birth control options. She is interested in a method, unsure which one. Information given.  Labor precautions   Term labor symptoms and general obstetric precautions including but not limited to vaginal bleeding,  contractions, leaking of fluid and fetal movement were reviewed in detail with the patient. Please refer to After Visit Summary for other counseling recommendations.   Return in about 1 week (around 06/03/2020).  Future Appointments  Date Time Provider Department Center  06/03/2020  8:55 AM Aviva Signs, CNM [facility] None    Venia Carbon, NP

## 2020-06-01 ENCOUNTER — Inpatient Hospital Stay (HOSPITAL_COMMUNITY)
Admission: AD | Admit: 2020-06-01 | Discharge: 2020-06-03 | DRG: 807 | Disposition: A | Discharge status: Home / Self Care | Payer: Medicaid Other | Attending: Obstetrics & Gynecology | Admitting: Obstetrics & Gynecology

## 2020-06-01 ENCOUNTER — Encounter (HOSPITAL_COMMUNITY): Payer: Self-pay | Admitting: Family Medicine

## 2020-06-01 ENCOUNTER — Other Ambulatory Visit: Payer: Self-pay

## 2020-06-01 DIAGNOSIS — Z20822 Contact with and (suspected) exposure to covid-19: Secondary | ICD-10-CM | POA: Diagnosis present

## 2020-06-01 DIAGNOSIS — O99019 Anemia complicating pregnancy, unspecified trimester: Secondary | ICD-10-CM | POA: Diagnosis present

## 2020-06-01 DIAGNOSIS — Z8616 Personal history of COVID-19: Secondary | ICD-10-CM | POA: Diagnosis not present

## 2020-06-01 DIAGNOSIS — O98511 Other viral diseases complicating pregnancy, first trimester: Secondary | ICD-10-CM | POA: Diagnosis present

## 2020-06-01 DIAGNOSIS — E0590 Thyrotoxicosis, unspecified without thyrotoxic crisis or storm: Secondary | ICD-10-CM | POA: Diagnosis present

## 2020-06-01 DIAGNOSIS — Z3A38 38 weeks gestation of pregnancy: Secondary | ICD-10-CM | POA: Diagnosis not present

## 2020-06-01 DIAGNOSIS — E559 Vitamin D deficiency, unspecified: Secondary | ICD-10-CM | POA: Diagnosis present

## 2020-06-01 DIAGNOSIS — O26893 Other specified pregnancy related conditions, third trimester: Secondary | ICD-10-CM | POA: Diagnosis present

## 2020-06-01 DIAGNOSIS — O9902 Anemia complicating childbirth: Secondary | ICD-10-CM | POA: Diagnosis present

## 2020-06-01 DIAGNOSIS — U071 COVID-19: Secondary | ICD-10-CM | POA: Diagnosis present

## 2020-06-01 DIAGNOSIS — Z3480 Encounter for supervision of other normal pregnancy, unspecified trimester: Secondary | ICD-10-CM

## 2020-06-01 DIAGNOSIS — O3680X0 Pregnancy with inconclusive fetal viability, not applicable or unspecified: Secondary | ICD-10-CM | POA: Diagnosis present

## 2020-06-01 LAB — CBC
HCT: 37.2 % (ref 36.0–46.0)
Hemoglobin: 11.4 g/dL — ABNORMAL LOW (ref 12.0–15.0)
MCH: 25.9 pg — ABNORMAL LOW (ref 26.0–34.0)
MCHC: 30.6 g/dL (ref 30.0–36.0)
MCV: 84.5 fL (ref 80.0–100.0)
Platelets: 240 10*3/uL (ref 150–400)
RBC: 4.4 MIL/uL (ref 3.87–5.11)
RDW: 17.3 % — ABNORMAL HIGH (ref 11.5–15.5)
WBC: 9.2 10*3/uL (ref 4.0–10.5)
nRBC: 0 % (ref 0.0–0.2)

## 2020-06-01 LAB — RESP PANEL BY RT-PCR (FLU A&B, COVID) ARPGX2
Influenza A by PCR: NEGATIVE
Influenza B by PCR: NEGATIVE
SARS Coronavirus 2 by RT PCR: NEGATIVE

## 2020-06-01 LAB — RPR: RPR Ser Ql: NONREACTIVE

## 2020-06-01 LAB — TYPE AND SCREEN
ABO/RH(D): O POS
Antibody Screen: NEGATIVE

## 2020-06-01 MED ORDER — LIDOCAINE HCL (PF) 1 % IJ SOLN
30.0000 mL | INTRAMUSCULAR | Status: DC | PRN
  Filled 2020-06-01: qty 30

## 2020-06-01 MED ORDER — LACTATED RINGERS IV SOLN: INTRAVENOUS | Status: DC

## 2020-06-01 MED ORDER — ACETAMINOPHEN 325 MG PO TABS
650.0000 mg | ORAL_TABLET | ORAL | Status: DC | PRN
  Administered 2020-06-01 – 2020-06-02 (×3): 650 mg via ORAL
  Filled 2020-06-01 (×3): qty 2

## 2020-06-01 MED ORDER — SOD CITRATE-CITRIC ACID 500-334 MG/5ML PO SOLN: 30.0000 mL | ORAL | Status: DC | PRN

## 2020-06-01 MED ORDER — IBUPROFEN 600 MG PO TABS
600.0000 mg | ORAL_TABLET | Freq: Four times a day (QID) | ORAL | Status: DC
  Administered 2020-06-01 – 2020-06-03 (×9): 600 mg via ORAL
  Filled 2020-06-01 (×9): qty 1

## 2020-06-01 MED ORDER — WITCH HAZEL-GLYCERIN EX PADS: 1.0000 "application " | MEDICATED_PAD | CUTANEOUS | Status: DC | PRN

## 2020-06-01 MED ORDER — TETANUS-DIPHTH-ACELL PERTUSSIS 5-2.5-18.5 LF-MCG/0.5 IM SUSY: 0.5000 mL | PREFILLED_SYRINGE | Freq: Once | INTRAMUSCULAR | Status: DC

## 2020-06-01 MED ORDER — OXYCODONE HCL 5 MG PO TABS
5.0000 mg | ORAL_TABLET | Freq: Once | ORAL | Status: AC
  Administered 2020-06-01: 5 mg via ORAL
  Filled 2020-06-01: qty 1

## 2020-06-01 MED ORDER — OXYCODONE-ACETAMINOPHEN 5-325 MG PO TABS: 1.0000 | ORAL_TABLET | ORAL | Status: DC | PRN

## 2020-06-01 MED ORDER — COCONUT OIL OIL: 1.0000 "application " | TOPICAL_OIL | Status: DC | PRN

## 2020-06-01 MED ORDER — OXYTOCIN BOLUS FROM INFUSION
333.0000 mL | Freq: Once | INTRAVENOUS | Status: AC
  Administered 2020-06-01: 333 mL via INTRAVENOUS

## 2020-06-01 MED ORDER — SENNOSIDES-DOCUSATE SODIUM 8.6-50 MG PO TABS
2.0000 | ORAL_TABLET | Freq: Every day | ORAL | Status: DC
  Administered 2020-06-02 – 2020-06-03 (×2): 2 via ORAL
  Filled 2020-06-01 (×2): qty 2

## 2020-06-01 MED ORDER — LACTATED RINGERS IV SOLN: 500.0000 mL | INTRAVENOUS | Status: DC | PRN

## 2020-06-01 MED ORDER — ONDANSETRON HCL 4 MG/2ML IJ SOLN: 4.0000 mg | Freq: Four times a day (QID) | INTRAMUSCULAR | Status: DC | PRN

## 2020-06-01 MED ORDER — ZOLPIDEM TARTRATE 5 MG PO TABS: 5.0000 mg | ORAL_TABLET | Freq: Every evening | ORAL | Status: DC | PRN

## 2020-06-01 MED ORDER — FENTANYL CITRATE (PF) 100 MCG/2ML IJ SOLN: 50.0000 ug | INTRAMUSCULAR | Status: DC | PRN

## 2020-06-01 MED ORDER — OXYTOCIN-SODIUM CHLORIDE 30-0.9 UT/500ML-% IV SOLN
2.5000 [IU]/h | INTRAVENOUS | Status: DC
  Filled 2020-06-01: qty 500

## 2020-06-01 MED ORDER — SIMETHICONE 80 MG PO CHEW: 80.0000 mg | CHEWABLE_TABLET | ORAL | Status: DC | PRN

## 2020-06-01 MED ORDER — DIBUCAINE (PERIANAL) 1 % EX OINT: 1.0000 "application " | TOPICAL_OINTMENT | CUTANEOUS | Status: DC | PRN

## 2020-06-01 MED ORDER — ACETAMINOPHEN 325 MG PO TABS
650.0000 mg | ORAL_TABLET | ORAL | Status: DC | PRN
  Filled 2020-06-01: qty 2

## 2020-06-01 MED ORDER — ONDANSETRON HCL 4 MG PO TABS: 4.0000 mg | ORAL_TABLET | ORAL | Status: DC | PRN

## 2020-06-01 MED ORDER — ONDANSETRON HCL 4 MG/2ML IJ SOLN: 4.0000 mg | INTRAMUSCULAR | Status: DC | PRN

## 2020-06-01 MED ORDER — DIPHENHYDRAMINE HCL 25 MG PO CAPS: 25.0000 mg | ORAL_CAPSULE | Freq: Four times a day (QID) | ORAL | Status: DC | PRN

## 2020-06-01 MED ORDER — BENZOCAINE-MENTHOL 20-0.5 % EX AERO
1.0000 "application " | INHALATION_SPRAY | CUTANEOUS | Status: DC | PRN
  Administered 2020-06-01 – 2020-06-03 (×2): 1 via TOPICAL
  Filled 2020-06-01 (×2): qty 56

## 2020-06-01 MED ORDER — OXYCODONE-ACETAMINOPHEN 5-325 MG PO TABS: 2.0000 | ORAL_TABLET | ORAL | Status: DC | PRN

## 2020-06-01 NOTE — H&P (Signed)
OBSTETRIC ADMISSION HISTORY AND PHYSICAL  Jannine Abreu is a 30 y.o. female [phone] with IUP at [redacted]w[redacted]d by 6wk u/s presenting for SOL. Patient has delivered in the MAU upon my arrival. She plans on breast feeding. She is undecided for birth control. She received her prenatal care at [facility]   Dating: By 6wk u/s --->  Estimated Date of Delivery: 06/09/20  Sono:    01/16/20@[redacted]w[redacted]d , CWD, normal anatomy, cephalic presentation, anterior placental lie, 324g, 83% EFW   Prenatal History/Complications:  Hyperthyroid (last tsh nml)   Past Medical History: Past Medical History:  Diagnosis Date  . Medical history non-contributory     Past Surgical History: Past Surgical History:  Procedure Laterality Date  . NOSE SURGERY      Obstetrical History: OB History    Gravida  4   Para  3   Term  3   Preterm      AB      Living  3     SAB      IAB      Ectopic      Multiple  0   Live Births  3           Social History Social History   Socioeconomic History  . Marital status: Single    Spouse name: Not on file  . Number of children: Not on file  . Years of education: Not on file  . Highest education level: Not on file  Occupational History  . Not on file  Tobacco Use  . Smoking status: Never Smoker  . Smokeless tobacco: Never Used  Vaping Use  . Vaping Use: Never used  Substance and Sexual Activity  . Alcohol use: Never  . Drug use: Never  . Sexual activity: Yes    Birth control/protection: None  Other Topics Concern  . Not on file  Social History Narrative  . Not on file   Social Determinants of Health   Financial Resource Strain: Not on file  Food Insecurity: Not on file  Transportation Needs: Not on file  Physical Activity: Not on file  Stress: Not on file  Social Connections: Not on file    Family History: Family History  Problem Relation Age of Onset  . Cancer Paternal Uncle        blood CA  . Healthy Mother   . Stroke Father   . Heart  disease Father   . Diabetes Neg Hx   . Hearing loss Neg Hx     Allergies: Allergies  Allergen Reactions  . Feraheme [Ferumoxytol] Shortness Of Breath, Anxiety and Other (See Comments)    Medications Prior to Admission  Medication Sig Dispense Refill Last Dose  . Ascorbic Acid (VITAMIN C PO) Take 1 tablet by mouth daily.   Past Week at Unknown time  . ferrous sulfate 325 (65 FE) MG tablet TAKE 1 TABLET BY MOUTH DAILY WITH BREAKFAST 100 tablet 2 Past Week at Unknown time  . Prenatal Vit-Fe Fumarate-FA (MULTIVITAMIN-PRENATAL) 27-0.8 MG TABS tablet Take 1 tablet by mouth daily at 12 noon.    Past Week at Unknown time  . VITAMIN D, CHOLECALCIFEROL, PO Take 1 tablet by mouth daily.   Past Week at Unknown time  . famotidine (PEPCID) 40 MG tablet Take 1 tablet by mouth daily.   Unknown at Unknown time  . fluticasone (FLONASE) 50 MCG/ACT nasal spray PLACE 1 SPRAY INTO BOTH NOSTRILS DAILY. 16 g 2   . Inulin (FIBER CHOICE PO) Take 1 tablet  by mouth daily as needed (Constipation).     . nitrofurantoin, macrocrystal-monohydrate, (MACROBID) 100 MG capsule TAKE 1 CAPSULE (100 MG TOTAL) BY MOUTH 2 (TWO) TIMES DAILY. 14 capsule 0      Review of Systems   All systems reviewed and negative except as stated in HPI  Blood pressure 117/79, pulse 99, temperature 97.6 F (36.4 C), temperature source Oral, resp. rate 18, height 5\' 3"  (1.6 m), weight 82 kg, last menstrual period 09/01/2019, SpO2 100 %. General appearance: alert, cooperative and no distress Lungs: normal respiratory effort Heart: regular rate and rhythm Abdomen: soft, non-tender; gravid Pelvic: first degree perineal laceration noted Extremities: Homans sign is negative, no sign of DVT   Prenatal labs: ABO, Rh: --/--/PENDING (04/10 03-12-1999) Antibody: PENDING (04/10 0802) Rubella: 22.80 (08/17 1143) RPR: Non Reactive (01/24 0846)  HBsAg: Negative (08/17 1143)  HIV: Non Reactive (01/24 0846)  GBS: Negative/-- (03/29 0958)  2 hr Glucola  passed Genetic screening  declined Anatomy 07-02-1983 normal  Prenatal Transfer Tool  Maternal Diabetes: No Genetic Screening: Declined Maternal Ultrasounds/Referrals: Normal Fetal Ultrasounds or other Referrals:  None Maternal Substance Abuse:  No Significant Maternal Medications:  None Significant Maternal Lab Results: Group B Strep negative  Results for orders placed or performed during the hospital encounter of 06/01/20 (from the past 24 hour(s))  Type and screen [facility]   Collection Time: 06/01/20  8:02 AM  Result Value Ref Range   ABO/RH(D) PENDING    Antibody Screen PENDING    Sample Expiration      06/04/2020,2359 Performed at [facility], [address], [place], [place]     Patient Active Problem List   Diagnosis Date Noted  . Normal labor 06/01/2020  . Tachycardia, unspecified 04/08/2020  . Insomnia secondary to restless leg syndrome 01/29/2020  . COVID-19 affecting pregnancy in first trimester 12/04/2019  . Hyperthyroidism 12/04/2019  . Supervision of other normal pregnancy, antepartum 10/11/2019  . Chronic neck pain 12/22/2018  . Vitamin D deficiency 12/22/2018  . Polyarthralgia 12/22/2018  . Anemia, antepartum 12/06/2017  . Pelvic pain affecting pregnancy 06/16/2017    Assessment/Plan:  Mareesa Gathright is a 30 y.o. G4P3003 at [redacted]w[redacted]d here for SOL. Patient delivered in MAU prior to my arrival with CNM [redacted]w[redacted]d.  #Labor: Expectant management. #Pain: PRN #FWB: Cat 1 #ID: GBS neg #MOF: breast #MOC: undecided #Circ: yes  Antony Odea, MD  06/01/2020, 8:31 AM

## 2020-06-01 NOTE — MAU Note (Signed)
Pt reports to mau with c/o ctx since 0500 this morning that are about q 5 min.  Pt denies LOF or vag bleeding. +FM

## 2020-06-01 NOTE — Discharge Summary (Signed)
Postpartum Discharge Summary  Date of Service updated 06/03/20     Patient Name: Bonnie Cross DOB: May 14, 1990 MRN: [id number]  Date of admission: 06/01/2020 Delivery date:06/01/2020  Delivering provider: Wende Mott  Date of discharge: 06/03/2020  Admitting diagnosis: Normal labor [O80, Z37.9] Intrauterine pregnancy: [redacted]w[redacted]d     Secondary diagnosis:  Active Problems:   Anemia, antepartum   Supervision of other normal pregnancy, antepartum   Vitamin D deficiency   COVID-19 affecting pregnancy in first trimester   Hyperthyroidism   Vaginal delivery  Additional problems: none    Discharge diagnosis: Term Pregnancy Delivered                                              Post partum procedures:IV venofer Augmentation: N/A Complications: None  Hospital course: Onset of Labor With Vaginal Delivery      30 y.o. yo [id number] at [redacted]w[redacted]d was admitted in Active Labor on 06/01/2020. Patient had an uncomplicated labor course as follows:  Membrane Rupture Time/Date: 8:02 AM ,06/01/2020   Delivery Method:Vaginal, Spontaneous  Episiotomy: None  Lacerations:  1st degree  Patient had an uncomplicated postpartum course.  She is ambulating, tolerating a regular diet, passing flatus, and urinating well. Patient is discharged home in stable condition on 06/03/20.  Newborn Data: Birth date:06/01/2020  Birth time:8:08 AM  Gender:Female  Living status:Living  Apgars:9 ,9  Weight:3225 g   Magnesium Sulfate received: No BMZ received: No Rhophylac:N/A MMR:N/A T-DaP:declines Flu: No Transfusion:No  Physical exam  Vitals:   06/02/20 0501 06/02/20 1550 06/02/20 2052 06/03/20 0542  BP: 104/66 104/82 115/82 109/72  Pulse: 83 79 93 83  Resp: $Remo'18 16 19 17  'zeGdZ$ Temp: 99.3 F (37.4 C) 98 F (36.7 C) 98 F (36.7 C) 98.2 F (36.8 C)  TempSrc: Oral Oral Axillary Oral  SpO2: 100% 100% 100% 99%  Weight:      Height:       General: alert, cooperative and no distress Lochia: appropriate Uterine  Fundus: firm Incision: N/A DVT Evaluation: No evidence of DVT seen on physical exam. Labs: Lab Results  Component Value Date   WBC 9.2 06/01/2020   HGB 11.4 (L) 06/01/2020   HCT 37.2 06/01/2020   MCV 84.5 06/01/2020   PLT 240 06/01/2020   CMP Latest Ref Rng & Units 04/11/2020  Glucose 65 - 99 mg/dL 93  BUN 6 - 20 mg/dL 6  Creatinine 0.57 - 1.00 mg/dL 0.44(L)  Sodium 134 - 144 mmol/L 136  Potassium 3.5 - 5.2 mmol/L 3.9  Chloride 96 - 106 mmol/L 104  CO2 20 - 29 mmol/L 17(L)  Calcium 8.7 - 10.2 mg/dL 8.7  Total Protein 6.5 - 8.1 g/dL -  Total Bilirubin 0.3 - 1.2 mg/dL -  Alkaline Phos 38 - 126 U/L -  AST 0 - 40 IU/L -  ALT 0 - 32 IU/L -   Edinburgh Score: Edinburgh Postnatal Depression Scale Screening Tool 06/02/2020  I have been able to laugh and see the funny side of things. 0  I have looked forward with enjoyment to things. 0  I have blamed myself unnecessarily when things went wrong. 0  I have been anxious or worried for no good reason. 0  I have felt scared or panicky for no good reason. 0  Things have been getting on top of me. 0  I have been so  unhappy that I have had difficulty sleeping. 0  I have felt sad or miserable. 0  I have been so unhappy that I have been crying. 0  The thought of harming myself has occurred to me. 0  Edinburgh Postnatal Depression Scale Total 0     After visit meds:  Allergies as of 06/03/2020      Reactions   Feraheme [ferumoxytol] Shortness Of Breath, Anxiety, Other (See Comments)      Medication List    STOP taking these medications   FIBER PO   fluticasone 50 MCG/ACT nasal spray Commonly known as: FLONASE   nitrofurantoin (macrocrystal-monohydrate) 100 MG capsule Commonly known as: MACROBID     TAKE these medications   acetaminophen 500 MG tablet Commonly known as: TYLENOL Take 2 tablets (1,000 mg total) by mouth every 6 (six) hours as needed (for pain scale < 4).   coconut oil Oil Apply 1 application topically as  needed.   ferrous sulfate 325 (65 FE) MG tablet Take 1 tablet (325 mg total) by mouth every other day. What changed:   how much to take  when to take this   ibuprofen 600 MG tablet Commonly known as: ADVIL Take 1 tablet (600 mg total) by mouth every 6 (six) hours as needed.   norethindrone 0.35 MG tablet Commonly known as: Ortho Micronor Take 1 tablet (0.35 mg total) by mouth daily.   prenatal multivitamin Tabs tablet Take 1 tablet by mouth daily at 12 noon.   vitamin C 250 MG tablet Commonly known as: ASCORBIC ACID Take 1 tablet (250 mg total) by mouth every other day. Take with ferrous sulfate   Vitamin D3 125 MCG (5000 UT) Caps Take 1 capsule by mouth daily.        Discharge home in stable condition Infant Feeding: Breast Infant Disposition:home with mother Discharge instruction: per After Visit Summary and Postpartum booklet. Activity: Advance as tolerated. Pelvic rest for 6 weeks.  Diet: routine diet Future Appointments: Future Appointments  Date Time Provider [place]  07/01/2020  9:15 AM Seabron Spates, CNM [facility] None   Follow up Visit:   Please schedule this patient for a In person postpartum visit in 4 weeks with the following provider: Any provider. Additional Postpartum F/U:n/a  Low risk pregnancy complicated by: n/a Delivery mode:  Vaginal, Spontaneous  Anticipated Birth Control:  POPs, rx sent to pharmacy.   06/02/3011 Arrie Senate, MD

## 2020-06-02 NOTE — Progress Notes (Signed)
Post Partum Day 1 Subjective: Patient is doing well without complaints. Ambulating without difficulty. Voiding and passing flatus. Tolerating PO. Abdominal pain improved. Vaginal bleeding decreased.  Objective: Blood pressure 104/66, pulse 83, temperature 99.3 F (37.4 C), temperature source Oral, resp. rate 18, height 5\' 3"  (1.6 m), weight 82 kg, last menstrual period 09/01/2019, SpO2 100 %, unknown if currently breastfeeding.  Physical Exam:  General: alert, cooperative and no distress Lochia: appropriate Uterine Fundus: firm Incision: n/a DVT Evaluation: No evidence of DVT seen on physical exam.  Recent Labs    06/01/20 0800  HGB 11.4*  HCT 37.2    Assessment/Plan: PPD#1  -doing well without complaints  -VSS  -undecided on contraception, counseled  -desires circ for baby, consented  Plan for discharge tomorrow per maternal request.   LOS: 1 day   08/01/20 06/02/2020, 6:55 AM

## 2020-06-02 NOTE — Progress Notes (Signed)
Pt c/o abdominal cramping 5/10 and requesting pain medication stronger than motrin and tylenol she had earlier. Call made to Dr. Germaine Pomfret who will order one time dose of oxycodone.

## 2020-06-03 ENCOUNTER — Encounter: Payer: Medicaid Other | Admitting: Advanced Practice Midwife

## 2020-06-03 ENCOUNTER — Other Ambulatory Visit (HOSPITAL_BASED_OUTPATIENT_CLINIC_OR_DEPARTMENT_OTHER): Payer: Self-pay

## 2020-06-03 MED ORDER — NORETHINDRONE 0.35 MG PO TABS
1.0000 | ORAL_TABLET | Freq: Every day | ORAL | 11 refills | Status: DC
  Filled 2020-06-03: qty 28, 28d supply, fill #0

## 2020-06-03 MED ORDER — FERROUS SULFATE 325 (65 FE) MG PO TABS: 325.0000 mg | ORAL_TABLET | ORAL | Status: DC

## 2020-06-03 MED ORDER — IBUPROFEN 600 MG PO TABS: 600.0000 mg | ORAL_TABLET | Freq: Four times a day (QID) | ORAL | 0 refills | Status: DC | PRN

## 2020-06-03 MED ORDER — VITAMIN C 250 MG PO TABS: 250.0000 mg | ORAL_TABLET | ORAL | Status: DC

## 2020-06-03 MED ORDER — ACETAMINOPHEN 500 MG PO TABS: 1000.0000 mg | ORAL_TABLET | Freq: Four times a day (QID) | ORAL | Status: DC | PRN

## 2020-06-03 MED ORDER — COCONUT OIL OIL: 1.0000 "application " | TOPICAL_OIL | 0 refills | Status: DC | PRN

## 2020-06-03 NOTE — Discharge Instructions (Signed)
-take tylenol 1000 mg every 6 hours as needed for pain, alternate with ibuprofen 600 mg every 6 hours -drink plenty of water to help with breastfeeding -continue prenatal vitamins while you are breastfeeding -take iron pills every other day with vitamin c, this will help healing as well as breast feeding -think about birth control options-->bedisider.org is a great website! You can get any form of birth control from the health department for free   Postpartum Care After Vaginal Delivery The following information offers guidance about how to care for yourself from the time you deliver your baby to 6-12 weeks after delivery (postpartum period). If you have problems or questions, contact your health care provider for more specific instructions. Follow these instructions at home: Vaginal bleeding  It is normal to have vaginal bleeding (lochia) after delivery. Wear a sanitary pad for bleeding and discharge. ? During the first week after delivery, the amount and appearance of lochia is often similar to a menstrual period. ? Over the next few weeks, it will gradually decrease to a dry, yellow-brown discharge. ? For most women, lochia stops completely by 4-6 weeks after delivery, but can vary.  Change your sanitary pads frequently. Watch for any changes in your flow, such as: ? A sudden increase in volume. ? A change in color. ? Large blood clots.  If you pass a blood clot from your vagina, save it and call your health care provider. Do not flush blood clots down the toilet before talking with your health care provider.  Do not use tampons or douches until your health care provider approves.  If you are not breastfeeding, your period should return 6-8 weeks after delivery. If you are feeding your baby breast milk only, your period may not return until you stop breastfeeding. Perineal care  Keep the area between the vagina and the anus (perineum) clean and dry. Use medicated pads and  pain-relieving sprays and creams as directed.  If you had a surgical cut in the perineum (episiotomy) or a tear, check the area for signs of infection until you are healed. Check for: ? More redness, swelling, or pain. ? Fluid or blood coming from the cut or tear. ? Warmth. ? Pus or a bad smell.  You may be given a squirt bottle to use instead of wiping to clean the perineum area after you use the bathroom. Pat the area gently to dry it.  To relieve pain caused by an episiotomy, a tear, or swollen veins in the anus (hemorrhoids), take a warm sitz bath 2-3 times a day. In a sitz bath, the warm water should only come up to your hips and cover your buttocks.   Breast care  In the first few days after delivery, your breasts may feel heavy, full, and uncomfortable (breast engorgement). Milk may also leak from your breasts. Ask your health care provider about ways to help relieve the discomfort.  If you are breastfeeding: ? Wear a bra that supports your breasts and fits well. Use breast pads to absorb milk that leaks. ? Keep your nipples clean and dry. Apply creams and ointments as told. ? You may have uterine contractions every time you breastfeed for up to several weeks after delivery. This helps your uterus return to its normal size. ? If you have any problems with breastfeeding, notify your health care provider or lactation consultant.  If you are not breastfeeding: ? Avoid touching your breasts. Do not squeeze out (express) milk. Doing this can make your   breasts produce more milk. ? Wear a good-fitting bra and use cold packs to help with swelling. Intimacy and sexuality  Ask your health care provider when you can engage in sexual activity. This may depend upon: ? Your risk of infection. ? How fast you are healing. ? Your comfort and desire to engage in sexual activity.  You are able to get pregnant after delivery, even if you have not had your period. Talk with your health care provider  about methods of birth control (contraception) or family planning if you desire future pregnancies. Medicines  Take over-the-counter and prescription medicines only as told by your health care provider.  Take an over-the-counter stool softener to help ease bowel movements as told by your health care provider.  If you were prescribed an antibiotic medicine, take it as told by your health care provider. Do not stop taking the antibiotic even if you start to feel better.  Review all previous and current prescriptions to check for possible transfer into breast milk. Activity  Gradually return to your normal activities as told by your health care provider.  Rest as much as possible. Nap while your baby is sleeping. Eating and drinking  Drink enough fluid to keep your urine pale yellow.  To help prevent or relieve constipation, eat high-fiber foods every day.  Choose healthy eating to support breastfeeding or weight loss goals.  Take your prenatal vitamins until your health care provider tells you to stop.   General tips/recommendations  Do not use any products that contain nicotine or tobacco. These products include cigarettes, chewing tobacco, and vaping devices, such as e-cigarettes. If you need help quitting, ask your health care provider.  Do not drink alcohol, especially if you are breastfeeding.  Do not take medications or drugs that are not prescribed to you, especially if you are breastfeeding.  Visit your health care provider for a postpartum checkup within the first 3-6 weeks after delivery.  Complete a comprehensive postpartum visit no later than 12 weeks after delivery.  Keep all follow-up visits for you and your baby. Contact a health care provider if:  You feel unusually sad or worried.  Your breasts become red, painful, or hard.  You have a fever or other signs of an infection.  You have bleeding that is soaking through one pad an hour or you have blood  clots.  You have a severe headache that doesn't go away or you have vision changes.  You have nausea and vomiting and are unable to eat or drink anything for 24 hours. Get help right away if:  You have chest pain or difficulty breathing.  You have sudden, severe leg pain.  You faint or have a seizure.  You have thoughts about hurting yourself or your baby. If you ever feel like you may hurt yourself or others, or have thoughts about taking your own life, get help right away. Go to your nearest emergency department or:  Call your local emergency services (911 in the [place]  The National Suicide Prevention Lifeline at 1-800-273-8255. This suicide crisis helpline is open 24 hours a day.  Text the Crisis Text Line at 741741 (in the [place] Summary  The period of time after you deliver your newborn up to 6-12 weeks after delivery is called the postpartum period.  Keep all follow-up visits for you and your baby.  Review all previous and current prescriptions to check for possible transfer into breast milk.  Contact a health care provider if you feel   unusually sad or worried during the postpartum period. This information is not intended to replace advice given to you by your health care provider. Make sure you discuss any questions you have with your health care provider. Document Revised: 10/25/2019 Document Reviewed: 10/25/2019 Elsevier Patient Education  2021 [employer]  

## 2020-06-05 ENCOUNTER — Other Ambulatory Visit (HOSPITAL_BASED_OUTPATIENT_CLINIC_OR_DEPARTMENT_OTHER): Payer: Self-pay

## 2020-07-01 ENCOUNTER — Ambulatory Visit: Payer: Medicaid Other | Admitting: Advanced Practice Midwife

## 2020-11-24 ENCOUNTER — Emergency Department (HOSPITAL_BASED_OUTPATIENT_CLINIC_OR_DEPARTMENT_OTHER)
Admission: EM | Admit: 2020-11-24 | Discharge: 2020-11-24 | Disposition: A | Discharge status: Home / Self Care | Payer: Medicaid Other | Attending: Student | Admitting: Student

## 2020-11-24 ENCOUNTER — Other Ambulatory Visit: Payer: Self-pay

## 2020-11-24 ENCOUNTER — Encounter (HOSPITAL_BASED_OUTPATIENT_CLINIC_OR_DEPARTMENT_OTHER): Payer: Self-pay

## 2020-11-24 ENCOUNTER — Emergency Department (HOSPITAL_BASED_OUTPATIENT_CLINIC_OR_DEPARTMENT_OTHER): Payer: Medicaid Other

## 2020-11-24 DIAGNOSIS — Z8616 Personal history of COVID-19: Secondary | ICD-10-CM | POA: Insufficient documentation

## 2020-11-24 DIAGNOSIS — R1031 Right lower quadrant pain: Secondary | ICD-10-CM | POA: Diagnosis present

## 2020-11-24 DIAGNOSIS — N132 Hydronephrosis with renal and ureteral calculous obstruction: Secondary | ICD-10-CM | POA: Insufficient documentation

## 2020-11-24 DIAGNOSIS — N200 Calculus of kidney: Secondary | ICD-10-CM

## 2020-11-24 LAB — URINALYSIS, ROUTINE W REFLEX MICROSCOPIC
Bilirubin Urine: NEGATIVE
Glucose, UA: NEGATIVE mg/dL
Ketones, ur: NEGATIVE mg/dL
Leukocytes,Ua: NEGATIVE
Nitrite: NEGATIVE
Protein, ur: NEGATIVE mg/dL
Specific Gravity, Urine: 1.025 (ref 1.005–1.030)
pH: 7 (ref 5.0–8.0)

## 2020-11-24 LAB — URINALYSIS, MICROSCOPIC (REFLEX): RBC / HPF: >50 RBC/hpf (ref 0–5)

## 2020-11-24 LAB — PREGNANCY, URINE: Preg Test, Ur: NEGATIVE

## 2020-11-24 MED ORDER — LACTATED RINGERS IV BOLUS
1000.0000 mL | Freq: Once | INTRAVENOUS | Status: AC
  Administered 2020-11-24: 1000 mL via INTRAVENOUS

## 2020-11-24 MED ORDER — TAMSULOSIN HCL 0.4 MG PO CAPS: 0.4000 mg | ORAL_CAPSULE | Freq: Every day | ORAL | 0 refills | Status: DC

## 2020-11-24 MED ORDER — TAMSULOSIN HCL 0.4 MG PO CAPS
0.4000 mg | ORAL_CAPSULE | Freq: Once | ORAL | Status: AC
  Administered 2020-11-24: 0.4 mg via ORAL
  Filled 2020-11-24: qty 1

## 2020-11-24 NOTE — Discharge Instructions (Addendum)
You were seen in the emergency department for evaluation of flank pain and bloody urine.  Your CAT scan today shows a 4 mm stone that is about to drop into the bladder.  It is important that we give appropriate fluid resuscitation and that you drink lots of fluid at home to flush this out.  You are given a medication today to help open up the urethral tracks and get the urine flowing.  In addition, your CAT scan incidentally shows a small lesion in the spleen.  This lesion is almost certainly benign, but does warrant follow-up with your primary care physician.  Please call your primary doctor to discuss the CT finding.  Please call the urology number above for follow-up and at this time you are safe for discharge.

## 2020-11-24 NOTE — ED Triage Notes (Signed)
Pt c/o right flank pain radiating to abdomen/groin. C/o blood in urine and nausea.

## 2020-11-24 NOTE — ED Provider Notes (Signed)
[facility] EMERGENCY DEPARTMENT Provider Note   CSN: [id number] Arrival date & time: 11/24/20  1823     History Chief Complaint  Patient presents with   Flank Pain    Bonnie Cross is a 30 y.o. female who presents the emergency department for evaluation of right flank pain.  She states that her pain began this morning and radiates around into the abdomen and groin.  She also endorses hematuria with associated nausea but no vomiting.  She states that at her worst the pain was an 8 out of 10 but she took Motrin prior to arrival and her pain is currently well controlled.  Pain is primarily in the right flank but she also endorses some mild right lower quadrant pain.  Denies chest pain, shortness of breath, fever, chills, cough or other systemic symptoms.   Flank Pain Pertinent negatives include no chest pain, no abdominal pain and no shortness of breath.      Past Medical History:  Diagnosis Date   Medical history non-contributory     Patient Active Problem List   Diagnosis Date Noted   Vaginal delivery 06/01/2020   Tachycardia, unspecified 04/08/2020   Insomnia secondary to restless leg syndrome 01/29/2020   COVID-19 affecting pregnancy in first trimester 12/04/2019   Hyperthyroidism 12/04/2019   Supervision of other normal pregnancy, antepartum 10/11/2019   Chronic neck pain 12/22/2018   Vitamin D deficiency 12/22/2018   Polyarthralgia 12/22/2018   Anemia, antepartum 12/06/2017   Pelvic pain affecting pregnancy 06/16/2017    Past Surgical History:  Procedure Laterality Date   NOSE SURGERY       OB History     Gravida  4   Para  4   Term  4   Preterm      AB      Living  4      SAB      IAB      Ectopic      Multiple  0   Live Births  4           Family History  Problem Relation Age of Onset   Cancer Paternal Uncle        blood CA   Healthy Mother    Stroke Father    Heart disease Father    Diabetes Neg Hx    Hearing  loss Neg Hx     Social History   Tobacco Use   Smoking status: Never   Smokeless tobacco: Never  Vaping Use   Vaping Use: Never used  Substance Use Topics   Alcohol use: Never   Drug use: Never    Home Medications Prior to Admission medications   Medication Sig Start Date End Date Taking? Authorizing Provider  tamsulosin (FLOMAX) 0.4 MG CAPS capsule Take 1 capsule (0.4 mg total) by mouth daily. 11/24/20  Yes Callan Yontz, MD  acetaminophen (TYLENOL) 500 MG tablet Take 2 tablets (1,000 mg total) by mouth every 6 (six) hours as needed (for pain scale < 4). 06/03/20   Alric Seton, MD  Cholecalciferol (VITAMIN D3) 125 MCG (5000 UT) CAPS Take 1 capsule by mouth daily.    [provider]  coconut oil OIL Apply 1 application topically as needed. 06/03/20   Alric Seton, MD  ferrous sulfate 325 (65 FE) MG tablet Take 1 tablet (325 mg total) by mouth every other day. 06/03/20 06/03/21  Alric Seton, MD  ibuprofen (ADVIL) 600 MG tablet Take 1 tablet (600 mg  total) by mouth every 6 (six) hours as needed. 06/03/20   Alric Seton, MD  norethindrone (ORTHO MICRONOR) 0.35 MG tablet Take 1 tablet (0.35 mg total) by mouth daily. 06/03/20 06/03/21  Alric Seton, MD  Prenatal Vit-Fe Fumarate-FA (PRENATAL MULTIVITAMIN) TABS tablet Take 1 tablet by mouth daily at 12 noon.    [provider]  vitamin C (ASCORBIC ACID) 250 MG tablet Take 1 tablet (250 mg total) by mouth every other day. Take with ferrous sulfate 06/03/20   Alric Seton, MD    Allergies    Feraheme [ferumoxytol]  Review of Systems   Review of Systems  Constitutional:  Negative for chills and fever.  HENT:  Negative for ear pain and sore throat.   Eyes:  Negative for pain and visual disturbance.  Respiratory:  Negative for cough and shortness of breath.   Cardiovascular:  Negative for chest pain and palpitations.  Gastrointestinal:  Negative for abdominal pain and vomiting.   Genitourinary:  Positive for dysuria, flank pain and urgency. Negative for hematuria.  Musculoskeletal:  Negative for arthralgias and back pain.  Skin:  Negative for color change and rash.  Neurological:  Negative for seizures and syncope.  All other systems reviewed and are negative.  Physical Exam Updated Vital Signs BP 120/84 (BP Location: Right Arm)   Pulse 90   Temp 98 F (36.7 C) (Oral)   Resp 16   Ht 5\' 3"  (1.6 m)   Wt 74.4 kg   LMP 11/04/2020 (Approximate)   SpO2 100%   BMI 29.05 kg/m   Physical Exam Vitals and nursing note reviewed.  Constitutional:      General: She is not in acute distress.    Appearance: She is well-developed.  HENT:     Head: Normocephalic and atraumatic.  Eyes:     Conjunctiva/sclera: Conjunctivae normal.  Cardiovascular:     Rate and Rhythm: Normal rate and regular rhythm.     Heart sounds: No murmur heard. Pulmonary:     Effort: Pulmonary effort is normal. No respiratory distress.     Breath sounds: Normal breath sounds.  Abdominal:     Palpations: Abdomen is soft.     Tenderness: There is abdominal tenderness (RLQ). There is right CVA tenderness.  Musculoskeletal:     Cervical back: Neck supple.  Skin:    General: Skin is warm and dry.  Neurological:     Mental Status: She is alert.    ED Results / Procedures / Treatments   Labs (all labs ordered are listed, but only abnormal results are displayed) Labs Reviewed  URINALYSIS, ROUTINE W REFLEX MICROSCOPIC - Abnormal; Notable for the following components:      Result Value   APPearance CLOUDY (*)    Hgb urine dipstick LARGE (*)    All other components within normal limits  URINALYSIS, MICROSCOPIC (REFLEX) - Abnormal; Notable for the following components:   Bacteria, UA RARE (*)    All other components within normal limits  PREGNANCY, URINE    EKG None  Radiology CT Renal Stone Study  Result Date: 11/24/2020 CLINICAL DATA:  Right flank pain blood in urine EXAM: CT  ABDOMEN AND PELVIS WITHOUT CONTRAST TECHNIQUE: Multidetector CT imaging of the abdomen and pelvis was performed following the standard protocol without IV contrast. COMPARISON:  None. FINDINGS: Lower chest: No acute abnormality. Hepatobiliary: No focal liver abnormality is seen. No gallstones, gallbladder wall thickening, or biliary dilatation. Pancreas: Unremarkable. No pancreatic ductal dilatation or surrounding inflammatory changes.  Spleen: 10 mm hypodense circumscribed lesion in the mid spleen, series 2, image 12. Adrenals/Urinary Tract: Adrenal glands are normal. Mild right hydronephrosis and hydroureter with Peri ureteral soft tissue stranding. This is secondary to a 4 mm stone at or just distal to the right UVJ. Small intrarenal stones lower pole left kidney. Punctate stones in the mid and lower right kidney. Stomach/Bowel: Stomach is within normal limits. Appendix appears normal. No evidence of bowel wall thickening, distention, or inflammatory changes. Vascular/Lymphatic: No significant vascular findings are present. No enlarged abdominal or pelvic lymph nodes. Reproductive: Uterus and bilateral adnexa are unremarkable. Other: No abdominal wall hernia or abnormality. No abdominopelvic ascites. Musculoskeletal: No acute or significant osseous findings. IMPRESSION: 1. Mild right hydronephrosis and hydroureter, secondary to a 4 mm stone at or just past the right UVJ. 2. Small left greater than right intrarenal stones 3. 10 mm circumscribed hypodense lesion in the spleen, indeterminate but probably benign. When the patient is clinically stable and able to follow directions and hold their breath (preferably as an outpatient) further evaluation with dedicated abdominal MRI should be considered. Electronically Signed   By: Jasmine Pang M.D.   On: 11/24/2020 22:14    Procedures Procedures   Medications Ordered in ED Medications  lactated ringers bolus 1,000 mL ( Intravenous Stopped 11/24/20 2244)   tamsulosin (FLOMAX) capsule 0.4 mg (0.4 mg Oral Given 11/24/20 2240)    ED Course  I have reviewed the triage vital signs and the nursing notes.  Pertinent labs & imaging results that were available during my care of the patient were reviewed by me and considered in my medical decision making (see chart for details).    MDM Rules/Calculators/A&P                           Patient seen the emergency department for evaluation of flank pain.  Urinalysis with large blood and no evidence of infection.  Renal stone study with mild right hydronephrosis and hydroureter secondary to a 4 mm stone at the UVJ.  There is also a 10 mm circumscribed hypodense lesion in the spleen that is incidental and I had a discussion with the patient about this and need for follow-up.  PCP resources were placed in the patient's discharge instructions as well as outpatient urology follow-up.  She was given Flomax and fluid resuscitation here in the emergency department and was discharged with outpatient urology follow-up. Final Clinical Impression(s) / ED Diagnoses Final diagnoses:  Nephrolithiasis    Rx / DC Orders ED Discharge Orders          Ordered    tamsulosin (FLOMAX) 0.4 MG CAPS capsule  Daily        11/24/20 2224             Glendora Score, MD 11/24/20 2346

## 2022-07-30 IMAGING — CT CT RENAL STONE PROTOCOL
2 of 4 series · 16 of 46 positions shown, 18 images · non-contrast
Comparison: None.

CLINICAL DATA: Right flank pain blood in urine

EXAM:
CT ABDOMEN AND PELVIS WITHOUT CONTRAST
TECHNIQUE: Multidetector CT imaging of the abdomen and pelvis was performed
following the standard protocol without IV contrast.

[Series 2: axial st · axial · 0.96mm/px · z∈[-782,-357]mm · 13 of 93 slices shown, 15 images]
[im 4/93  soft-tissue]
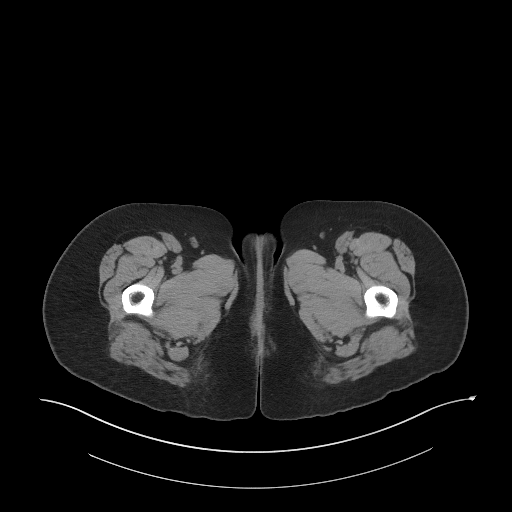
[im 4/93  bone]
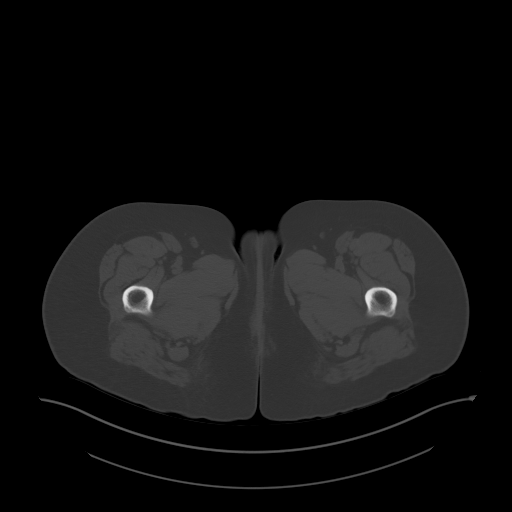
[im 11/93  soft-tissue]
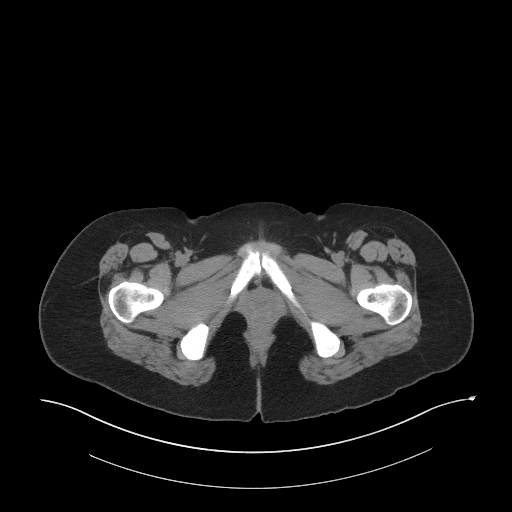
[im 18/93  soft-tissue]
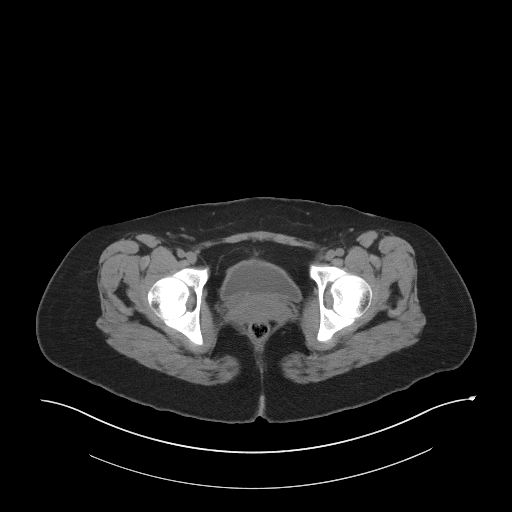
[im 25/93  soft-tissue]
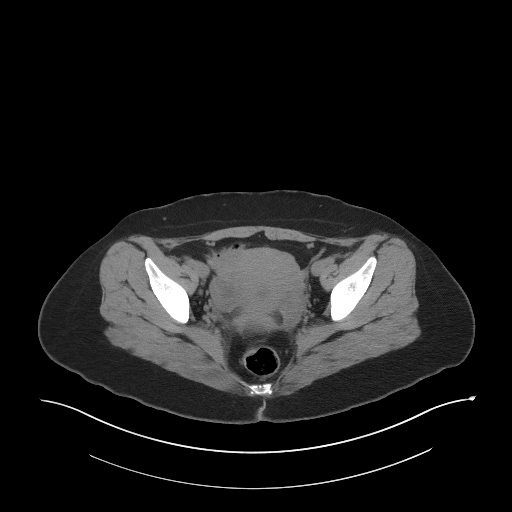
[im 32/93  soft-tissue]
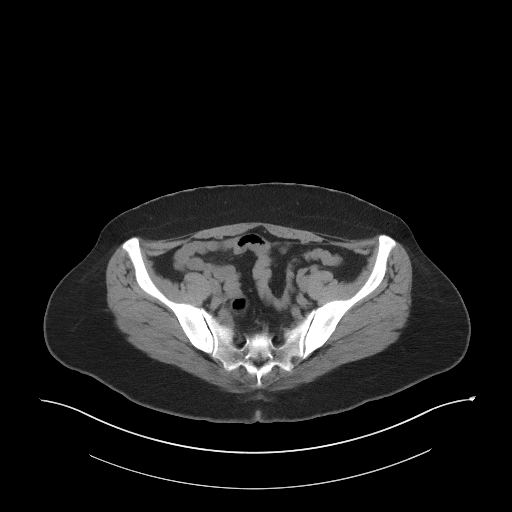
[im 39/93  soft-tissue]
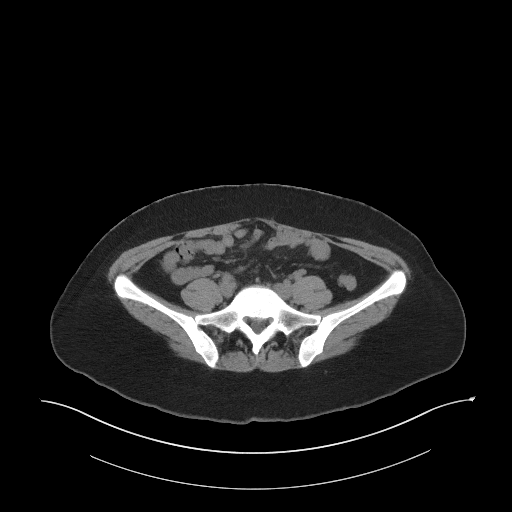
[im 47/93  soft-tissue]
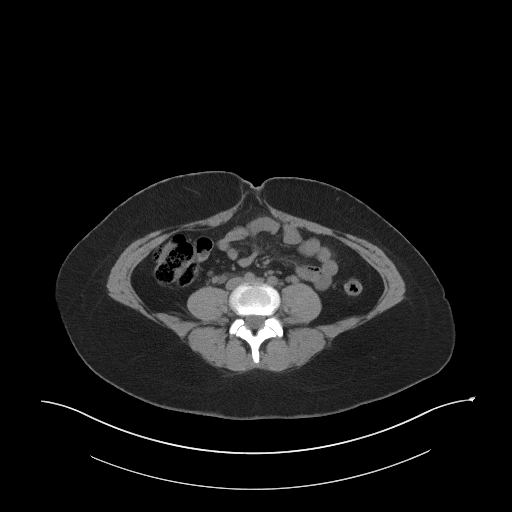
[im 54/93  soft-tissue]
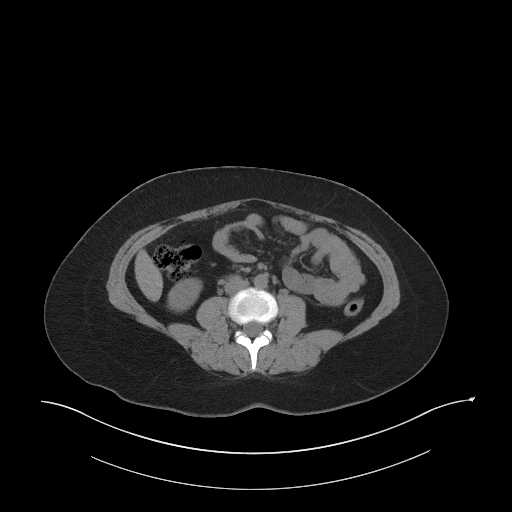
[im 61/93  soft-tissue]
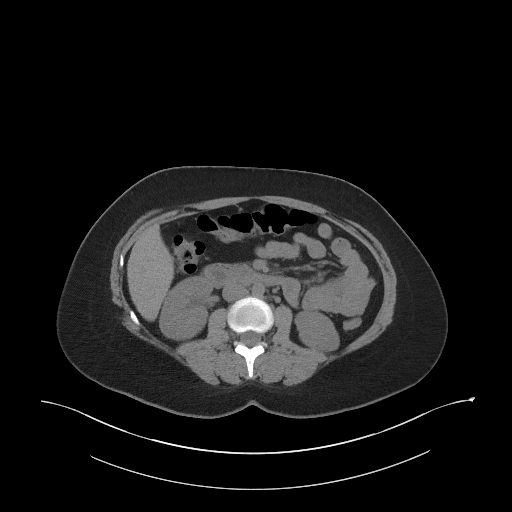
[im 61/93  bone]
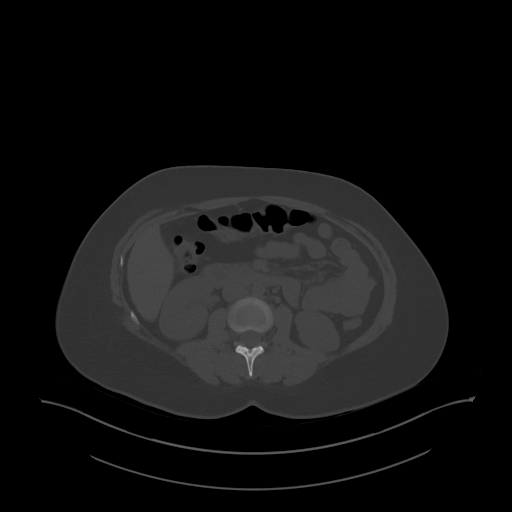
[im 68/93  soft-tissue]
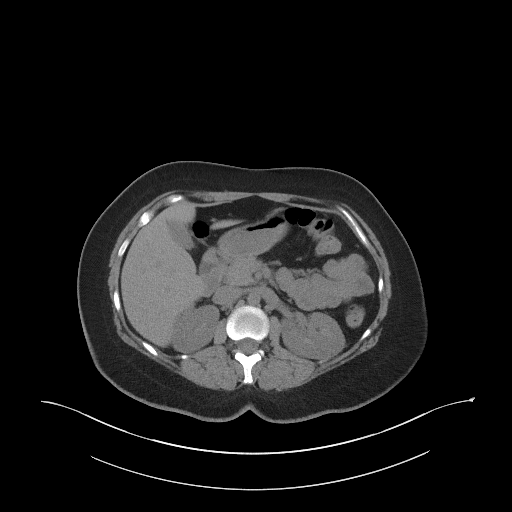
[im 75/93  soft-tissue]
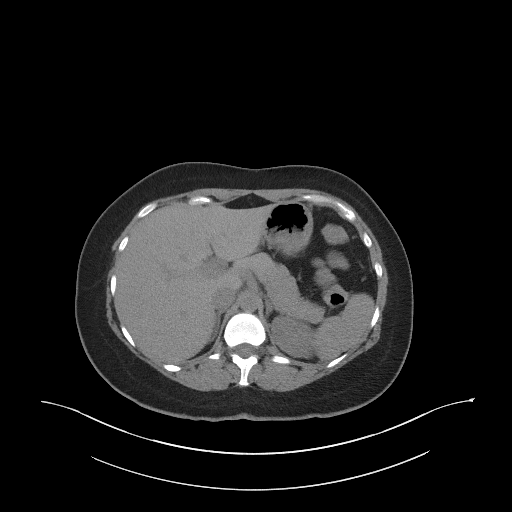
[im 82/93  soft-tissue]
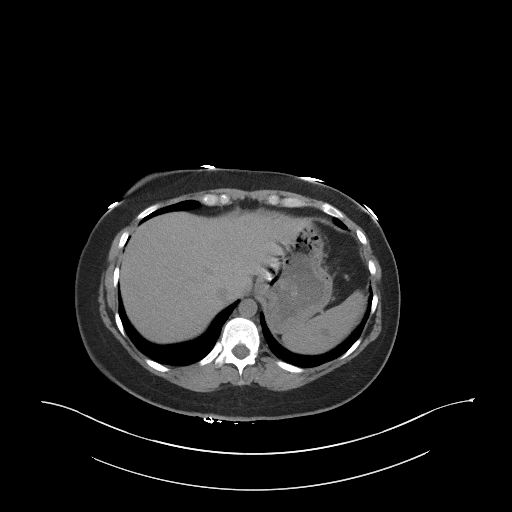
[im 89/93  soft-tissue]
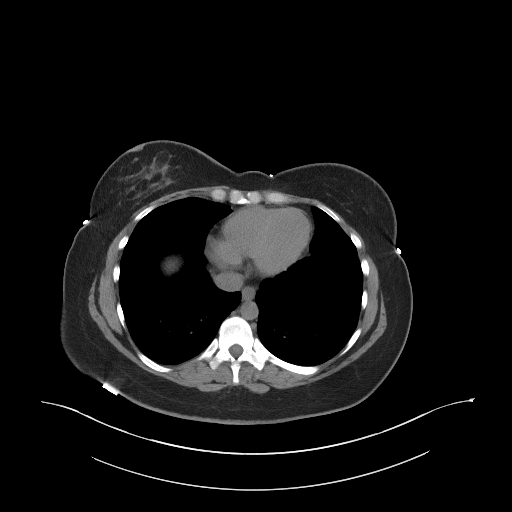

[Series 5: coronal st · coronal · 0.72mm/px · 3 of 76 slices shown]
[im 26/76  soft-tissue]
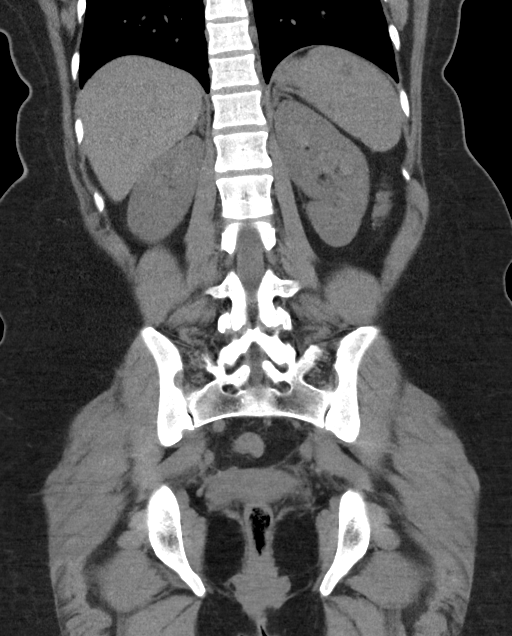
[im 34/76  soft-tissue]
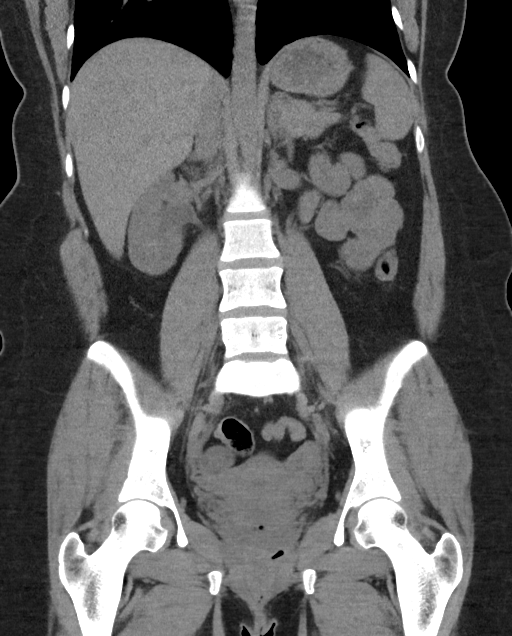
[im 42/76  soft-tissue]
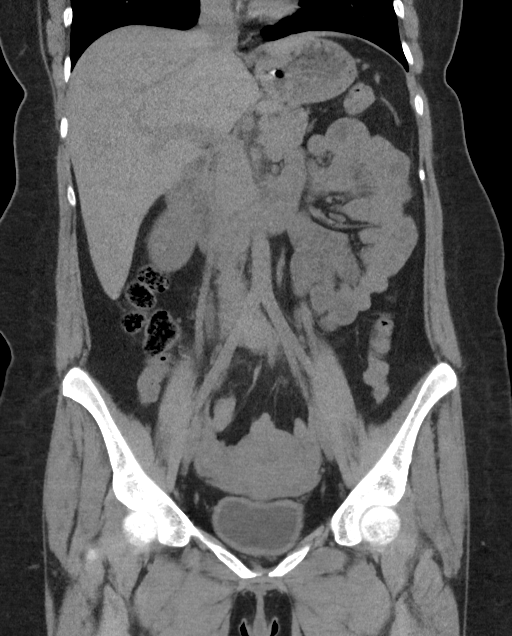

[16 of 46 positions shown; findings below may reference images not displayed]

FINDINGS: Lower chest: No acute abnormality.

Hepatobiliary: No focal liver abnormality is seen. No gallstones,
gallbladder wall thickening, or biliary dilatation.

Pancreas: Unremarkable. No pancreatic ductal dilatation or
surrounding inflammatory changes.

Spleen: 10 mm hypodense circumscribed lesion in the mid spleen,
series 2, image 12.

Adrenals/Urinary Tract: Adrenal glands are normal. Mild right
hydronephrosis and hydroureter with Peri ureteral soft tissue
stranding. This is secondary to a 4 mm stone at or just distal to
the right UVJ. Small intrarenal stones lower pole left kidney.
Punctate stones in the mid and lower right kidney.

Stomach/Bowel: Stomach is within normal limits. Appendix appears
normal. No evidence of bowel wall thickening, distention, or
inflammatory changes.

Vascular/Lymphatic: No significant vascular findings are present. No
enlarged abdominal or pelvic lymph nodes.

Reproductive: Uterus and bilateral adnexa are unremarkable.

Other: No abdominal wall hernia or abnormality. No abdominopelvic
ascites.

Musculoskeletal: No acute or significant osseous findings.
IMPRESSION: 1. Mild right hydronephrosis and hydroureter, secondary to a 4 mm
stone at or just past the right UVJ.
2. Small left greater than right intrarenal stones
3. 10 mm circumscribed hypodense lesion in the spleen, indeterminate
but probably benign. When the patient is clinically stable and able
to follow directions and hold their breath (preferably as an
outpatient) further evaluation with dedicated abdominal MRI should
be considered.

## 2023-02-24 NOTE — Progress Notes (Addendum)
 Bonnie Cross is a 33 y.o. female with PMH significant for  Chief complaint: This patient is seen in follow up for kidney stones.   History of Present Illness  02/23/2022: Bonnie Cross is a 33yo female being seen in follow up for nephrolithiasis. She denies knowledge of passing any stones since she was last seen. She will have occasionally right sided flank pain.    IMAGING: 02/24/2023: RUS: FINDINGS: .  Right Kidney: Length = 11.7 cm. Normal contour and echogenicity. No hydronephrosis or perinephric fluid. No focal mass is identified. 3 mm echogenic structure with twinkle artifact in the upper and interpolar region of the right kidney consistent with nonobstructing calculi. .  Left Kidney: Length = 11.9 cm. Normal contour and echogenicity. No hydronephrosis or perinephric fluid. No focal mass is identified.  .  Bladder: Normal. .  Additional comments: None.   IMPRESSION:  2 nonobstructing right renal calculi measuring 3 mm in size. No hydronephrosis.  02/24/2023: KUB: FINDINGS: . Gastrointestinal tract: Nonobstructive bowel gas pattern. . Calcifications: No definite radiopaque nephrolithiasis, however please note that renal shadows are obscured by stool. . Musculoskeletal: No acute bony abnormality. . Lower chest: No acute process. . Lines/Tubes/Surgical: None. . Additional comments: None.   IMPRESSION: No definite radiopaque nephrolithiasis, however please note that renal shadows are obscured by stool.    01/21/2022: Bonnie Cross is a 33 y.o. female with a PMH significant for hyperthyroidism, cervical myofascial pain syndrom, polyarthralgia, Vitamin D deficiency, Vitamin B12 deficiency, positive ANA, insomnia due to restless leg syndrome, and lesion of spleen. EMR reviewed. Presents today for follow up of kidney stones. Pt is established with Dr. Herb.  Stone History: First stone episode was 11/2020. Patient had CT completed at ED 11/24/2020 that confirmed right  4mm UVJ stone. A KUB was completed 11/28/2020 that showed no obvious stone. Last Procedure: N/A Stone composition: N/A Medications: N/A  Metabolic Evaluation from 07/22/2021: Uric acid, BMP, Intact PTH within normal limits; Litholink to be considered if additional stone episode  Patient presents today in routine follow-up for kidney stones.  Pt states doing well without concerns of renal colic today. Denies interim stone passage. Denies any fevers, chills, N/V, CP, SOB, flank pain, frequency, urgency, hematuria or abnormal urinary characteristics.  Prior imaging:  KUB 07/22/2021 CONCLUSION: No radiodense renal calculi, however sensitivity is decreased by enteric contents. Please see same day ultrasound for details.  RUS 07/22/2021 CONCLUSION: 1.  No hydronephrosis. 2.  Bilateral nephrolithiasis.  Imaging today:  RUS: Preliminary report CONCLUSION: No evidence of hydronephrosis. Nonobstructing bilateral nephrolithiasis.  KUB: Pending final report   Radiologic/Medical Testing Data Reviewed  ROS A comprehensive ROS was performed with pertinent positives/negatives noted in the HPI. The remainder of the ROS are negative.   Medications:  Current Outpatient Medications  Medication Sig Dispense Refill  . albuterol  HFA (PROVENTIL  HFA;VENTOLIN  HFA;PROAIR  HFA) 90 mcg/actuation inhaler Inhale 2 puffs every 6 (six) hours as needed for wheezing or shortness of breath. 1 each 1  . cholecalciferol (VITAMIN D3) 2,000 unit tablet Take 2,000 Units by mouth Once Daily. 90 tablet 1  . clindamycin 1 % swab Apply 1 Pad topically 2 (two) times a day. (Cleanse affected skin topically twice per day) 180 each 0  . cyanocobalamin (VITAMIN B12) 1,000 mcg tablet Take 1,000 mcg by mouth Once Daily. 90 tablet 1   No current facility-administered medications for this visit.     Allergies:  Allergies  Allergen Reactions  . Ferumoxytol  Angioedema, Other (See Comments) and Shortness Of  Breath     Past  Medical History:  Diagnosis Date  . Breast symptom in antepartum period 10/12/2017   Did not tolerate Fereheme Does not want to continue Will take iron bid with OJ and adjust diet  . Cramping affecting pregnancy, antepartum 06/16/2017  . Normal labor 01/16/2018  . Pruritic condition 12/06/2017   Bile Acids normal  . Spotting affecting pregnancy 06/16/2017   Formatting of this note might be different from the original.  Nursing Staff Provider  Office Location  [facility] Dating  7 week US   Language  English Anatomy US   WNL  Flu Vaccine  12/06/17 - Pt req to wait for next ob visit Genetic Screen   declines   TDaP vaccine  12/06/17 - req to wait for next ob visit Hgb A1C or  GTT Early  Third trimester   Rhogam    n/a   LAB RESULTS   Feeding Plan   breast Blood   . SVD (spontaneous vaginal delivery) 01/16/2018     Past Surgical History:  Procedure Laterality Date  . NOSE SURGERY     Procedure: NOSE SURGERY    Family History  Problem Relation Name Age of Onset  . Heart disease Father    . Stroke Maternal Grandmother    . Thyroid disease Neg Hx      Social History   Socioeconomic History  . Marital status: Married    Spouse name: Not on file  . Number of children: Not on file  . Years of education: Not on file  . Highest education level: Not on file  Occupational History  . Not on file  Tobacco Use  . Smoking status: Never  . Smokeless tobacco: Never  Substance and Sexual Activity  . Alcohol use: Never  . Drug use: Never    Comment: Drug use: Denies  . Sexual activity: Not on file  Other Topics Concern  . Not on file  Social History Narrative  . Not on file   Social Drivers of Health   Food Insecurity: Low Risk  (01/05/2023)   Food vital sign   . Within the past 12 months, you worried that your food would run out before you got money to buy more: Never true   . Within the past 12 months, the food you bought just didn't last and you didn't have money to get more: Never true   Transportation Needs: No Transportation Needs (01/05/2023)   Transportation   . In the past 12 months, has lack of reliable transportation kept you from medical appointments, meetings, work or from getting things needed for daily living? : No  Safety: Not At Risk (12/26/2022)   Received from [facility]   HITS   . Over the last 12 months how often did your partner physically hurt you?: Never   . Over the last 12 months how often did your partner insult you or talk down to you?: Never   . Over the last 12 months how often did your partner threaten you with physical harm?: Never   . Over the last 12 months how often did your partner scream or curse at you?: Never  Living Situation: Low Risk  (01/05/2023)   Living Situation   . What is your living situation today?: I have a steady place to live   . Think about the place you live. Do you have problems with any of the following? Choose all that apply:: None/None on this list     Vitals:  02/24/23 1357  BP: 105/71  Pulse: 76  Temp: 98.6 F (37 C)     Body mass index is 26.57 kg/m.   Physical Examination: Constitutional:  No obvious distress Cardiovascular: Normal rate Respiratory: No audible wheezing/stridor; respirations do not appear labored Gastrointestinal: Abdomen not distended Musculoskeletal: Normal ROM of UEs Skin: No obvious rashes/open sores Hematologic/Lymphatic/Immunologic: No obvious bruises or sites of spontaneous bleeding Genitourinary: No CVA tenderness, bladder not palpable.   All Radiologic/Medical Testing Data Reviewed   Discussed the following plan  1. Bilateral nephrolithiasis  US  Renal Bilateral Complete   XR Abdomen 1 View    2. Calculus of kidney  US  Renal Bilateral Complete   XR Abdomen 1 View      1.Nephrolithiasis   We discussed RUS and KUB from today. Small right sided stones with no need for surgical interventions at this time. She will follow up in 12 months with repeat imaging.  Orders have been placed.   Delon Sumner

## 2023-10-28 ENCOUNTER — Other Ambulatory Visit (HOSPITAL_COMMUNITY)
Admission: RE | Admit: 2023-10-28 | Discharge: 2023-10-28 | Disposition: A | Discharge status: Home / Self Care | Source: Ambulatory Visit | Attending: Obstetrics and Gynecology | Admitting: Obstetrics and Gynecology

## 2023-10-28 ENCOUNTER — Ambulatory Visit (INDEPENDENT_AMBULATORY_CARE_PROVIDER_SITE_OTHER): Admitting: Obstetrics and Gynecology

## 2023-10-28 VITALS — BP 115/73 | HR 89 | Resp 16 | Ht 63.0 in | Wt 156.0 lb

## 2023-10-28 DIAGNOSIS — N920 Excessive and frequent menstruation with regular cycle: Secondary | ICD-10-CM | POA: Diagnosis not present

## 2023-10-28 DIAGNOSIS — Z124 Encounter for screening for malignant neoplasm of cervix: Secondary | ICD-10-CM

## 2023-10-28 NOTE — Progress Notes (Signed)
 NEW GYNECOLOGY VISIT Chief Complaint  Patient presents with   Gynecologic Exam    Complaining of heavy menses passing clots. Headaches with menses.     Subjective:  Bonnie Cross is a 33 y.o. [id number] who presents for heavy periods.  Reports periods lately have been much heavier and has body stiffness/headaches with her periods. Periods are regular, last 5-7 days.   Gyn History: Patient's last menstrual period was 10/20/2023 (exact date). Sexually active: yes/no: Yes Contraception: condoms History of STIs: No Last pap:  Lab Results  Component Value Date   DIAGPAP  10/09/2019    - Negative for intraepithelial lesion or malignancy (NILM)   History of abnormal pap: No Periods: regular  OB History     Gravida  4   Para  4   Term  4   Preterm      AB      Living  4      SAB      IAB      Ectopic      Multiple  0   Live Births  4           Past Medical History:  Diagnosis Date   Medical history non-contributory     Past Surgical History:  Procedure Laterality Date   NOSE SURGERY      Social History   Socioeconomic History   Marital status: Single    Spouse name: Not on file   Number of children: Not on file   Years of education: Not on file   Highest education level: Not on file  Occupational History   Not on file  Tobacco Use   Smoking status: Never   Smokeless tobacco: Never  Vaping Use   Vaping status: Never Used  Substance and Sexual Activity   Alcohol use: Never   Drug use: Never   Sexual activity: Yes    Birth control/protection: None  Other Topics Concern   Not on file  Social History Narrative   Not on file   Social Drivers of Health   Financial Resource Strain: Not on file  Food Insecurity: Low Risk  (01/05/2023)   Received from [facility]   Hunger Vital Sign    Within the past 12 months, you worried that your food would run out before you got money to buy more: Never true    Within the past 12 months, the  food you bought just didn't last and you didn't have money to get more. : Never true  Transportation Needs: No Transportation Needs (01/05/2023)   Received from [facility]    In the past 12 months, has lack of reliable transportation kept you from medical appointments, meetings, work or from getting things needed for daily living? : No  Physical Activity: Not on file  Stress: Not on file  Social Connections: Unknown (12/26/2022)   Received from [facility]   Social Network    Social Network: Not on file    Family History  Problem Relation Age of Onset   Cancer Paternal Uncle        blood CA   Healthy Mother    Stroke Father    Heart disease Father    Diabetes Neg Hx    Hearing loss Neg Hx     Current Outpatient Medications on File Prior to Visit  Medication Sig Dispense Refill   Multiple Vitamin (MULTIVITAMINS PO) Take 1 capsule by mouth daily.     acetaminophen  (TYLENOL )  500 MG tablet Take 2 tablets (1,000 mg total) by mouth every 6 (six) hours as needed (for pain scale < 4).     Cholecalciferol (VITAMIN D3) 125 MCG (5000 UT) CAPS Take 1 capsule by mouth daily.     coconut oil OIL Apply 1 application topically as needed.  0   ferrous sulfate  325 (65 FE) MG tablet Take 1 tablet (325 mg total) by mouth every other day.     ibuprofen  (ADVIL ) 600 MG tablet Take 1 tablet (600 mg total) by mouth every 6 (six) hours as needed. 30 tablet 0   norethindrone  (ORTHO MICRONOR ) 0.35 MG tablet Take 1 tablet (0.35 mg total) by mouth daily. 28 tablet 11   Prenatal Vit-Fe Fumarate-FA (PRENATAL MULTIVITAMIN) TABS tablet Take 1 tablet by mouth daily at 12 noon.     tamsulosin  (FLOMAX ) 0.4 MG CAPS capsule Take 1 capsule (0.4 mg total) by mouth daily. 5 capsule 0   vitamin C  (ASCORBIC ACID) 250 MG tablet Take 1 tablet (250 mg total) by mouth every other day. Take with ferrous sulfate      No current facility-administered medications on file prior to visit.    Allergies   Allergen Reactions   Feraheme  [Ferumoxytol ] Shortness Of Breath, Anxiety and Other (See Comments)     Objective:   Vitals:   10/28/23 1126  BP: 115/73  Pulse: 89  Weight: 156 lb (70.8 kg)  Height: 5' 3 (1.6 m)   Physical Examination:   General appearance - well appearing, and in no distress  Mental status - alert, oriented to person, place, and time  Psych:  normal mood and affect  Abdomen - soft, nontender, nondistended, no masses or organomegaly  Pelvic -  VULVA: normal appearing vulva with no masses, tenderness or lesions   VAGINA: normal appearing vagina with normal color and discharge, no lesions   CERVIX: normal appearing cervix without discharge or lesions, no CMT  Thin prep pap is done with HR HPV cotesting  UTERUS: uterus is felt to be normal size, shape, consistency and nontender   ADNEXA: No adnexal masses or tenderness noted.  Extremities:  No swelling or varicosities noted  Chaperone present for exam  Assessment and Plan:  1. Menorrhagia with regular cycle (Primary) Will check ultrasound and labs. Had thyroid abnormality during last pregnancy but this resolved per patient, no meds. - US  PELVIC COMPLETE WITH TRANSVAGINAL; Future - CBC - TSH Rfx on Abnormal to Free T4  2. Cervical cancer screening - Cytology - PAP    Rollo ONEIDA Bring, MD, FACOG Obstetrician & Gynecologist, [facility] for [facility], [facility]

## 2023-10-31 ENCOUNTER — Ambulatory Visit (HOSPITAL_BASED_OUTPATIENT_CLINIC_OR_DEPARTMENT_OTHER)
Admission: RE | Admit: 2023-10-31 | Discharge: 2023-10-31 | Disposition: A | Discharge status: Home / Self Care | Source: Ambulatory Visit | Attending: Obstetrics and Gynecology | Admitting: Obstetrics and Gynecology

## 2023-10-31 DIAGNOSIS — N920 Excessive and frequent menstruation with regular cycle: Secondary | ICD-10-CM | POA: Diagnosis present

## 2023-11-01 ENCOUNTER — Ambulatory Visit: Payer: Self-pay | Admitting: Obstetrics and Gynecology

## 2023-11-01 DIAGNOSIS — N83201 Unspecified ovarian cyst, right side: Secondary | ICD-10-CM

## 2023-11-01 LAB — TSH RFX ON ABNORMAL TO FREE T4: TSH: 1.8 u[IU]/mL (ref 0.450–4.500)

## 2023-11-02 LAB — CYTOLOGY - PAP
Comment: NEGATIVE
Diagnosis: NEGATIVE
High risk HPV: NEGATIVE

## 2023-11-06 NOTE — Progress Notes (Addendum)
 History of Present Illness: Bonnie Cross is a 33 y.o. female who presents today for follow up. Last seen on 02/24/23 by Delon Greet, NP.  PMH significant for hyperthyroidism, cervical myofascial pain syndrom, polyarthralgia, Vitamin D deficiency, Vitamin B12 deficiency, positive ANA, insomnia due to restless leg syndrome, and lesion of spleen   - History of kidney stones.  First stone episode was 11/2020. Patient had CT completed at ED 11/24/2020 that confirmed right 4mm UVJ stone. A KUB was completed 11/28/2020 that showed no obvious stone. Last Procedure: N/A Stone composition: N/A Medications: N/A  Metabolic Evaluation from 07/22/2021: Uric acid, BMP, Intact PTH within normal limits; Litholink to be considered if additional stone episode  Patient is established with Dr. Herb.   She reports intermittent right flank pain that occurs mostly in the morning. When present can be rated about 8/10. She denies abdominal pain. She denies fevers/chills/sweats. She denies nausea/vomiting.  She urinates every 1 to 2 hour per day. She can get up about 3 x at night sometimes. She reports intermittent urgency. She reports intermittent dysuria, usually in the morning. She denies gross hematuria. She denies the need to strain to void. She reports intermittent sensations of incomplete emptying.    Prior Imaging - RUS on 02/24/23: FINDINGS:   .  Right Kidney: Length = 11.7 cm. Normal contour and echogenicity. No hydronephrosis or perinephric fluid. No focal mass is identified. 3 mm echogenic structure with twinkle artifact in the upper and interpolar region of the right kidney consistent with nonobstructing calculi. .  Left Kidney: Length = 11.9 cm. Normal contour and echogenicity. No hydronephrosis or perinephric fluid. No focal mass is identified.  .  Bladder: Normal. .  Additional comments: None.   IMPRESSION: 2 nonobstructing right renal calculi measuring 3 mm in size. No  hydronephrosis.   Fall Screening: Do you usually have a device to assist in your mobility? no    Medications:  Current Medications[1]   Allergies:  Allergies[2]   Medical History[3]   Surgical History[4]  Family History[5]  Social History   Socioeconomic History  . Marital status: Married    Spouse name: Not on file  . Number of children: Not on file  . Years of education: Not on file  . Highest education level: Not on file  Occupational History  . Not on file  Tobacco Use  . Smoking status: Never  . Smokeless tobacco: Never  Substance and Sexual Activity  . Alcohol use: Never  . Drug use: Never    Comment: Drug use: Denies  . Sexual activity: Not on file  Other Topics Concern  . Not on file  Social History Narrative  . Not on file   Social Drivers of Health   Food Insecurity: Low Risk  (01/05/2023)   Food vital sign   . Within the past 12 months, you worried that your food would run out before you got money to buy more: Never true   . Within the past 12 months, the food you bought just didn't last and you didn't have money to get more: Never true  Transportation Needs: No Transportation Needs (01/05/2023)   Transportation   . In the past 12 months, has lack of reliable transportation kept you from medical appointments, meetings, work or from getting things needed for daily living? : No  Safety: Not At Risk (12/26/2022)   Received from [facility]   HITS   . Over the last 12 months how often did your partner physically hurt  you?: Never   . Over the last 12 months how often did your partner insult you or talk down to you?: Never   . Over the last 12 months how often did your partner threaten you with physical harm?: Never   . Over the last 12 months how often did your partner scream or curse at you?: Never  Living Situation: Low Risk  (01/05/2023)   Living Situation   . What is your living situation today?: I have a steady place to live   . Think about  the place you live. Do you have problems with any of the following? Choose all that apply:: None/None on this list      SUBJECTIVE  Review of Systems Constitutional:  Patient denies any unintentional weight loss or change in strength Integumentary:  Patient denies any rashes or pruritus Eyes:  Patient denies double vision or eye pain Ears/Nose/Mouth/Throat:  Patient denies any nosebleeds or gum bleeding Cardiovascular:  Patient denies chest pain or syncope Respiratory: Patient denies hemoptysis  Gastrointestinal:  Patient denies nausea, vomiting, constipation, or diarrhea Musculoskeletal:  Patient denies muscle cramps or weakness Neurologic: Patient denies convulsions or seizures Psychiatric: Patient denies hallucinations or suicidal ideations Allergic/Immunologic: Patient denies food allergies Hematologic/Lymphatic: Patient denies bleeding tendencies Endocrine:  Patient denies heat/cold intolerance  GU: As per HPI.   OBJECTIVE Vitals:   11/10/23 1355  BP: 119/70  Pulse: 104  Temp: 99 F (37.2 C)  SpO2: 99%     Body mass index is 27.46 kg/m.  Physical Examination  Constitutional:  No obvious distress Cardiovascular: Pulse is regular Respiratory: The patient does not have audible wheezing/stridor; respirations do not appear labored Gastrointestinal: Abdomen non-distended Musculoskeletal: Normal ROM of UEs Skin: No obvious rashes/open sores Neurologic: CN 2-12 grossly intact Psychiatric: Answered questions appropriately w/normal affect Hematologic/Lymphatic/Immunologic: No obvious bruises or sites of spontaneous bleeding Genitourinary: No CVA tenderness, bladder is not palpable.   UA: positive for small leukocytes PVR: 11 mL  Radiologic/Medical Testing Data Reviewed     ASSESSMENT 1) Right nonobstructing kidney stone 2) Right flank pain (intermittent)  Reviewed prior imaging and recommend that we proceed with imaging to verify kidney stone involvement in  intermittent right flank pain reported. Will prescribe flomax  0.4 mg daily and will contact pt to review imaging. All questions were answered.   PLAN Advised the following:  1. Increase fluid intake.  2. Low oxalate diet.  3. RUS and KUB today. 4. Urine culture sent 5. Follow-up as previously scheduled.   Called pt to review imaging, there are bilateral nonobstructing kidney stones. No hydronephrosis noted. We discussed kidney stone prevention. Patient's symptoms is unlikely related to kidney stone, advised to contact PCP to verify musculoskeletal involvement if pain persist. Pt verbalized understanding. ED precautions were provided. All questions were answered.   Orders Placed This Encounter  Procedures  . Surgical Urine Culture, Sterile Collection    Release to patient::   Immediate  . US  Renal Bilateral Complete    Standing Status:   Future    Number of Occurrences:   1    Expected Date:   11/10/2023    Expiration Date:   01/08/2026    Is the patient pregnant?:   Unknown    Release to patient::   Immediate    Scheduling group::   In Clinic  . XR Abdomen 1 View    Standing Status:   Future    Number of Occurrences:   1    Expected Date:  11/10/2023    Expiration Date:   01/09/2026    Is the patient pregnant?:   No    Release to patient::   Immediate  . Urinalysis without Microscopic    Standing Status:   Future    Number of Occurrences:   1    Expected Date:   11/10/2023    Expiration Date:   11/09/2024    Electronically signed by: Bonnie Alaba Fadeyi, DNP 11/10/2023 6:03 PM       [1] Current Outpatient Medications  Medication Sig Dispense Refill  . cholecalciferol (VITAMIN D3) 2,000 unit tablet Take 2,000 Units by mouth Once Daily. 90 tablet 1  . clindamycin 1 % swab Apply 1 Pad topically 2 (two) times a day. (Cleanse affected skin topically twice per day) 180 each 0  . cyanocobalamin (VITAMIN B12) 1,000 mcg tablet Take 1,000 mcg by mouth Once Daily. 90 tablet 1   . multivitamin-iron-folic acid  (CENTRUM) 18-400 mg-mcg tab tablet Take 1 tablet by mouth daily.    . tamsulosin  (FLOMAX ) 0.4 mg cap Take 1 capsule by mouth daily as needed for stone symptoms. Advised to contact urology provider / request office visit if stone symptoms fail to resolve. Go to ER if symptoms become severe. 30 capsule 1   No current facility-administered medications for this visit.  [2] Allergies Allergen Reactions  . Ferumoxytol  Angioedema, Other (See Comments) and Shortness Of Breath  [3] Past Medical History: Diagnosis Date  . Breast symptom in antepartum period (CMD) 10/12/2017   Did not tolerate Fereheme Does not want to continue Will take iron bid with OJ and adjust diet  . Cramping affecting pregnancy, antepartum (CMD) 06/16/2017  . Normal labor (CMD) 01/16/2018  . Pruritic condition 12/06/2017   Bile Acids normal  . Spotting affecting pregnancy (CMD) 06/16/2017   Formatting of this note might be different from the original.  Nursing Staff Provider  Office Location  [facility] Dating  7 week US   Language  English Anatomy US   WNL  Flu Vaccine  12/06/17 - Pt req to wait for next ob visit Genetic Screen   declines   TDaP vaccine  12/06/17 - req to wait for next ob visit Hgb A1C or  GTT Early  Third trimester   Rhogam    n/a   LAB RESULTS   Feeding Plan   breast Blood   . SVD (spontaneous vaginal delivery) (CMD) 01/16/2018  [4] Past Surgical History: Procedure Laterality Date  . NOSE SURGERY     Procedure: NOSE SURGERY  [5] Family History Problem Relation Name Age of Onset  . Heart disease Father    . Stroke Maternal Grandmother    . Thyroid disease Neg Hx

## 2024-01-23 ENCOUNTER — Encounter (HOSPITAL_BASED_OUTPATIENT_CLINIC_OR_DEPARTMENT_OTHER): Payer: Self-pay

## 2024-01-23 ENCOUNTER — Other Ambulatory Visit: Payer: Self-pay

## 2024-01-23 ENCOUNTER — Encounter: Payer: Self-pay | Admitting: Obstetrics and Gynecology

## 2024-01-23 ENCOUNTER — Emergency Department (HOSPITAL_BASED_OUTPATIENT_CLINIC_OR_DEPARTMENT_OTHER)
Admission: EM | Admit: 2024-01-23 | Discharge: 2024-01-23 | Disposition: A | Discharge status: Home / Self Care | Attending: Emergency Medicine | Admitting: Emergency Medicine

## 2024-01-23 ENCOUNTER — Emergency Department (HOSPITAL_BASED_OUTPATIENT_CLINIC_OR_DEPARTMENT_OTHER)

## 2024-01-23 DIAGNOSIS — Z3A01 Less than 8 weeks gestation of pregnancy: Secondary | ICD-10-CM | POA: Diagnosis not present

## 2024-01-23 DIAGNOSIS — O26891 Other specified pregnancy related conditions, first trimester: Secondary | ICD-10-CM | POA: Insufficient documentation

## 2024-01-23 DIAGNOSIS — R102 Pelvic and perineal pain unspecified side: Secondary | ICD-10-CM | POA: Diagnosis not present

## 2024-01-23 DIAGNOSIS — Z8616 Personal history of COVID-19: Secondary | ICD-10-CM | POA: Diagnosis not present

## 2024-01-23 DIAGNOSIS — N83202 Unspecified ovarian cyst, left side: Secondary | ICD-10-CM | POA: Insufficient documentation

## 2024-01-23 DIAGNOSIS — N83201 Unspecified ovarian cyst, right side: Secondary | ICD-10-CM | POA: Insufficient documentation

## 2024-01-23 LAB — URINALYSIS, W/ REFLEX TO CULTURE (INFECTION SUSPECTED)
Bilirubin Urine: NEGATIVE
Glucose, UA: NEGATIVE mg/dL
Ketones, ur: 5 mg/dL — AB
Nitrite: NEGATIVE
Protein, ur: NEGATIVE mg/dL
Specific Gravity, Urine: 1.02 (ref 1.005–1.030)
pH: 7 (ref 5.0–8.0)

## 2024-01-23 LAB — CBC WITH DIFFERENTIAL/PLATELET
Abs Immature Granulocytes: 0.02 K/uL (ref 0.00–0.07)
Basophils Absolute: 0.1 K/uL (ref 0.0–0.1)
Basophils Relative: 1 %
Eosinophils Absolute: 0.1 K/uL (ref 0.0–0.5)
Eosinophils Relative: 1 %
HCT: 34.6 % — ABNORMAL LOW (ref 36.0–46.0)
Hemoglobin: 11.4 g/dL — ABNORMAL LOW (ref 12.0–15.0)
Immature Granulocytes: 0 %
Lymphocytes Relative: 30 %
Lymphs Abs: 1.8 K/uL (ref 0.7–4.0)
MCH: 28.5 pg (ref 26.0–34.0)
MCHC: 32.9 g/dL (ref 30.0–36.0)
MCV: 86.5 fL (ref 80.0–100.0)
Monocytes Absolute: 0.6 K/uL (ref 0.1–1.0)
Monocytes Relative: 10 %
Neutro Abs: 3.5 K/uL (ref 1.7–7.7)
Neutrophils Relative %: 58 %
Platelets: 270 K/uL (ref 150–400)
RBC: 4 MIL/uL (ref 3.87–5.11)
RDW: 14.3 % (ref 11.5–15.5)
WBC: 6 K/uL (ref 4.0–10.5)
nRBC: 0 % (ref 0.0–0.2)

## 2024-01-23 LAB — COMPREHENSIVE METABOLIC PANEL WITH GFR
ALT: 11 U/L (ref 0–44)
AST: 20 U/L (ref 15–41)
Albumin: 4.3 g/dL (ref 3.5–5.0)
Alkaline Phosphatase: 47 U/L (ref 38–126)
Anion gap: 10 (ref 5–15)
BUN: 11 mg/dL (ref 6–20)
CO2: 22 mmol/L (ref 22–32)
Calcium: 9.1 mg/dL (ref 8.9–10.3)
Chloride: 106 mmol/L (ref 98–111)
Creatinine, Ser: 0.59 mg/dL (ref 0.44–1.00)
GFR, Estimated: >60 mL/min (ref >60)
Glucose, Bld: 106 mg/dL — ABNORMAL HIGH (ref 70–99)
Potassium: 4 mmol/L (ref 3.5–5.1)
Sodium: 139 mmol/L (ref 135–145)
Total Bilirubin: 0.3 mg/dL (ref 0.0–1.2)
Total Protein: 7.3 g/dL (ref 6.5–8.1)

## 2024-01-23 LAB — HCG, QUANTITATIVE, PREGNANCY: hCG, Beta Chain, Quant, S: 2005 m[IU]/mL — ABNORMAL HIGH (ref <5)

## 2024-01-23 LAB — LIPASE, BLOOD: Lipase: 36 U/L (ref 11–51)

## 2024-01-23 MED ORDER — SODIUM CHLORIDE 0.9 % IV BOLUS
500.0000 mL | Freq: Once | INTRAVENOUS | Status: AC
  Administered 2024-01-23: 500 mL via INTRAVENOUS

## 2024-01-23 NOTE — ED Provider Notes (Signed)
 Emergency Department Provider Note   I have reviewed the triage vital signs and the nursing notes.   HISTORY  Chief Complaint Abdominal Pain (pregnant)   HPI Bonnie Cross is a 33 y.o. female presents to the emergency with right lower quadrant abdominal pain and new pregnancy.  Patient took a home pregnancy test which was positive and later confirmed at her PCP.  She has had right lower quadrant abdominal pain for the past week.  Her last menstrual cycle was in September.  She is not having any vaginal bleeding, syncope, vomiting, diarrhea.  She states this pregnancy was a surprise to her.  She is not having any dysuria, hesitancy, urgency.   Past Medical History:  Diagnosis Date   COVID-19 affecting pregnancy in first trimester 12/04/2019   Had covid 11/22/19  Mild disease pattern  Advised to get Covid vaccine     Medical history non-contributory     Review of Systems  Constitutional: No fever/chills Cardiovascular: Denies chest pain. Respiratory: Denies shortness of breath. Gastrointestinal: Positive RLQ abdominal pain.  No nausea, no vomiting.  No diarrhea.   Genitourinary: Negative for dysuria. Musculoskeletal: Negative for back pain. Skin: Negative for rash. Neurological: Negative for headaches.  ____________________________________________   PHYSICAL EXAM:  VITAL SIGNS: ED Triage Vitals  Encounter Vitals Group     BP 01/23/24 1120 120/82     Pulse Rate 01/23/24 1120 94     Resp 01/23/24 1120 17     Temp 01/23/24 1120 98.3 F (36.8 C)     Temp Source 01/23/24 1120 Oral     SpO2 01/23/24 1120 100 %     Weight 01/23/24 1320 156 lb 1.4 oz (70.8 kg)     Height 01/23/24 1320 5' 3 (1.6 m)   Constitutional: Alert and oriented. Well appearing and in no acute distress. Eyes: Conjunctivae are normal.  Head: Atraumatic. Nose: No congestion/rhinnorhea. Mouth/Throat: Mucous membranes are moist.   Neck: No stridor. Cardiovascular: Normal rate, regular rhythm.  Good peripheral circulation. Grossly normal heart sounds.   Respiratory: Normal respiratory effort.  No retractions. Lungs CTAB. Gastrointestinal: Soft with minimal RLQ tenderness. No distention.  Musculoskeletal: No gross deformities of extremities. Neurologic:  Normal speech and language.  Skin:  Skin is warm, dry and intact. No rash noted.  ____________________________________________   LABS (all labs ordered are listed, but only abnormal results are displayed)  Labs Reviewed  COMPREHENSIVE METABOLIC PANEL WITH GFR - Abnormal; Notable for the following components:      Result Value   Glucose, Bld 106 (*)    All other components within normal limits  CBC WITH DIFFERENTIAL/PLATELET - Abnormal; Notable for the following components:   Hemoglobin 11.4 (*)    HCT 34.6 (*)    All other components within normal limits  HCG, QUANTITATIVE, PREGNANCY - Abnormal; Notable for the following components:   hCG, Beta Chain, Quant, S 2,005 (*)    All other components within normal limits  URINALYSIS, W/ REFLEX TO CULTURE (INFECTION SUSPECTED) - Abnormal; Notable for the following components:   APPearance HAZY (*)    Ketones, ur 5 (*)    Leukocytes,Ua TRACE (*)    Bacteria, UA FEW (*)    All other components within normal limits  LIPASE, BLOOD   ____________________________________________  RADIOLOGY  US  OB LESS THAN 14 WEEKS WITH OB TRANSVAGINAL Result Date: 01/23/2024 CLINICAL DATA:  [id number] RLQ abdominal pain [id number] EXAM: OBSTETRIC <14 WK US  AND TRANSVAGINAL OB US  TECHNIQUE: Both transabdominal and transvaginal ultrasound examinations  were performed for complete evaluation of the gestation as well as the maternal uterus, adnexal regions, and pelvic cul-de-sac. Transvaginal technique was performed to assess early pregnancy. COMPARISON:  None Available. FINDINGS: Intrauterine gestational sac: Not present, at this time. Yolk sac:  Not present, at this time. Fetal Pole:  Not present, at this time.  Subchorionic hemorrhage:  None visualized. Maternal uterus/adnexae: The uterus appears within normal limits. Heterogeneously thickened endometrium measuring 3.1 cm thick. In the right ovary, there is an anechoic cyst measuring 6 x 5.4 x 5.8 cm. In the left ovary, there is a hypoechoic structure measuring 2.1 x 2 x 2.1 cm, probably a corpus luteal cyst. Trace free fluid in the pelvis. IMPRESSION: 1. No intrauterine pregnancy. In the setting of a positive beta HCG, differential considerations include an early pregnancy, complete miscarriage, or an unseen ectopic pregnancy. Obstetric consultation with serial beta hCG and sonographic follow-up should be obtained, as clinically warranted. 2. Heterogeneously thickened endometrium measuring 3.1 cm thick. In the setting of positive beta HCG, this may be physiologic. Attention on follow-up imaging recommended. 3. Unchanged right ovarian cyst, measuring 6 x 5.4 x 5.8 cm. Electronically Signed   By: Rogelia Myers M.D.   On: 01/23/2024 13:55    ____________________________________________   PROCEDURES  Procedure(s) performed:   Procedures  None  ____________________________________________   INITIAL IMPRESSION / ASSESSMENT AND PLAN / ED COURSE  Pertinent labs & imaging results that were available during my care of the patient were reviewed by me and considered in my medical decision making (see chart for details).   This patient is Presenting for Evaluation of abdominal pain, which does require a range of treatment options, and is a complaint that involves a high risk of morbidity and mortality.  The Differential Diagnoses includes but is not exclusive to ectopic pregnancy, ovarian cyst, ovarian torsion, acute appendicitis, urinary tract infection, endometriosis, bowel obstruction, hernia, colitis, renal colic, gastroenteritis, volvulus etc.   Critical Interventions-    Medications  sodium chloride  0.9 % bolus 500 mL (0 mLs Intravenous Stopped  01/23/24 1318)    Reassessment after intervention: symptoms improved.   Clinical Laboratory Tests Ordered, included quantitative hCG of 2005.  No UTI.  CBC without leukocytosis.  No acute kidney injury.  LFTs and bilirubin normal.  Radiologic Tests Ordered, included Pelvic US . I independently interpreted the images and agree with radiology interpretation.   Cardiac Monitor Tracing which shows NSR.    Social Determinants of Health Risk patient is a non-smoker.   Consult complete with OB Dr. Izell. He will follow up with patient at the [facility]. They will reach out to patient by phone for follow up hCG.   Medical Decision Making: Summary:  The patient presents to the emergency department with right lower quadrant abdominal pain and new pregnancy.  She appears very well.  Minimal tenderness on exam.  No vaginal bleeding.  Hemodynamically she has been stable in the ED.  She has an hCG at around 2000 and no IUP visualized on transvaginal ultrasound.   Reevaluation with update and discussion with patient.  Discussed the case with OB/GYN.  They will be following with her in the coming 24 to 48 hours for repeat quantitative hCG.  Discussed strict ED return precautions should any worsening pain develop. Patient verbalizes understanding.    Patient's presentation is most consistent with acute presentation with potential threat to life or bodily function.   Disposition: discharge  ____________________________________________  FINAL CLINICAL IMPRESSION(S) / ED DIAGNOSES  Final diagnoses:  Pelvic pain in female  Less than [redacted] weeks gestation of pregnancy    Note:  This document was prepared using Dragon voice recognition software and may include unintentional dictation errors.  Fonda Law, MD, [facility]    Jadrien Narine, Fonda MATSU, MD 01/24/24 0700

## 2024-01-23 NOTE — Telephone Encounter (Signed)
 I spoke with patient and advised her that she would need to establish care with a new provider and discuss this at her office visit. She voiced understanding.

## 2024-01-23 NOTE — Progress Notes (Signed)
 Payor: [place] [employer] / Plan: [place] [employer] / Product Type: Managed Medicaid /   History of Present Illness   History of Present Illness The patient presents for evaluation of right-sided pelvic pain with dizziness and cramping for the past 3 days.  She has been experiencing right-sided pelvic pain, accompanied by dizziness and cramping, persisting for the past 3 days. A home pregnancy test was conducted, yielding a positive result, which was confirmed by a repeat test in the clinic. Her last menstrual period was in 10/2023, and she missed her period in 11/2023. She reports no instances of diarrhea, vomiting, or vaginal bleeding. She is a mother of 4 children and had not planned for this current pregnancy. She has an established relationship with an OB/GYN at the same hospital where she delivered her other children.  GYNECOLOGICAL HISTORY: Last Menstrual Period: 11/2023   Review of Systems  All other systems reviewed and are negative.   Past Contributory History   Current Medications[1] Allergies[2] Medical History[3] Surgical History[4] Family History[5]    Physical Exam   Vitals:   01/23/24 1041  BP: 113/74  Pulse: 98  Resp: 16  Temp: 97.9 F (36.6 C)  TempSrc: Temporal  SpO2: 98%  Weight: 71.7 kg (158 lb)  Height: 1.6 m (5' 3)   Physical Exam Vitals and nursing note reviewed.  Constitutional:      General: She is not in acute distress.    Appearance: Normal appearance. She is not ill-appearing, toxic-appearing or diaphoretic.  HENT:     Head: Normocephalic.  Eyes:     General: No scleral icterus.    Conjunctiva/sclera: Conjunctivae normal.  Cardiovascular:     Rate and Rhythm: Normal rate.  Pulmonary:     Effort: Pulmonary effort is normal. No respiratory distress.  Abdominal:     General: There is no distension.  Musculoskeletal:     Cervical back: Neck supple.  Skin:    General: Skin is warm and dry.     Coloration: Skin is not jaundiced or  pale.  Neurological:     Mental Status: She is alert.     Gait: Gait normal.  Psychiatric:        Mood and Affect: Mood normal.        Behavior: Behavior normal.     Procedures   Procedures  Results   Behavioral Health Screening  Patient Health Questionnaire-2 Score: 0 (01/23/2024 10:43 AM)      Patient's Depression screening/score = Negative       Lab results: Recent Results (from the past 24 hours)  POC HCG Qualitative, Urine   Collection Time: 01/23/24 10:43 AM  Result Value Ref Range   HCG, Urine, POC Positive (A) Negative   Internal Control Acceptable    Kit/Device Lot # [id number]    Kit/Device Expiration Date 01/21/2025     X-ray results: Radiology Results (last 7 days)     ** No results found for the last 168 hours. **       Diagnosis & Disposition   1. Pelvic pain during pregnancy (CMD)  POC HCG Qualitative, Urine    2. Pregnancy, unspecified gestational age (CMD)  POC HCG Qualitative, Urine      Assessment & Plan 1. Suspected ectopic pregnancy: - Symptoms include right-sided pelvic pain, dizziness, and cramping for the past 3 days. - Positive pregnancy test confirmed. - Advised to seek immediate medical attention at the emergency room for an ultrasound to confirm the diagnosis. - Informed that the  facility lacks the necessary resources to conduct the necessary assessment and subsequent treatment in the case of ectopic pregnancy.  Counseled regarding condition(s) and all patient questions answered. Reviewed worrisome signs and symptoms that would warrant proceeding to an emergency department.  All questions have been answered and the patient has voiced understanding and agreement with the plan of care.   No follow-ups on file.  There are no Patient Instructions on file for this visit.   This documented history was created with the aid of Dragon dictation software as well as [employer], an automated transcribing artificial intelligence program, with  light post-production editing.      [1] Current Outpatient Medications  Medication Sig Dispense Refill  . cholecalciferol (VITAMIN D3) 2,000 unit tablet Take 2,000 Units by mouth Once Daily. 90 tablet 1  . clindamycin 1 % swab Apply 1 Pad topically 2 (two) times a day. (Cleanse affected skin topically twice per day) 180 each 0  . cyanocobalamin (VITAMIN B12) 1,000 mcg tablet Take 1,000 mcg by mouth Once Daily. 90 tablet 1  . multivitamin-iron-folic acid  (CENTRUM) 18-400 mg-mcg tab tablet Take 1 tablet by mouth daily.    . tamsulosin  (FLOMAX ) 0.4 mg cap Take 1 capsule by mouth daily as needed for stone symptoms. Advised to contact urology provider / request office visit if stone symptoms fail to resolve. Go to ER if symptoms become severe. 30 capsule 1   No current facility-administered medications for this visit.  [2] Allergies Allergen Reactions  . Ferumoxytol  Angioedema, Other (See Comments) and Shortness Of Breath  [3] Past Medical History: Diagnosis Date  . Breast symptom in antepartum period (CMD) 10/12/2017   Did not tolerate Fereheme Does not want to continue Will take iron bid with OJ and adjust diet  . Cramping affecting pregnancy, antepartum (CMD) 06/16/2017  . Normal labor (CMD) 01/16/2018  . Pruritic condition 12/06/2017   Bile Acids normal  . Spotting affecting pregnancy (CMD) 06/16/2017   Formatting of this note might be different from the original.  Nursing Staff Provider  Office Location  [facility] Dating  7 week US   Language  English Anatomy US   WNL  Flu Vaccine  12/06/17 - Pt req to wait for next ob visit Genetic Screen   declines   TDaP vaccine  12/06/17 - req to wait for next ob visit Hgb A1C or  GTT Early  Third trimester   Rhogam    n/a   LAB RESULTS   Feeding Plan   breast Blood   . SVD (spontaneous vaginal delivery) (CMD) 01/16/2018  [4] Past Surgical History: Procedure Laterality Date  . NOSE SURGERY     Procedure: NOSE SURGERY  [5] Family History Problem Relation  Name Age of Onset  . Heart disease Father    . Stroke Maternal Grandmother    . Thyroid disease Neg Hx

## 2024-01-23 NOTE — Telephone Encounter (Signed)
 Copied from CRM #[id number]. Topic: Clinical Concerns - Emergent Call >> Jan 23, 2024  8:55 AM Sari DASEN wrote: Kennie Roderic Re is calling other request    Include all details related to the request(s) below:  Patient took a pregnancy test, they were positive, she has been experiencing some right side pain with dizziness with cramping    Confirm and type the Best Contact Number below:  Patient/caller contact number:          [phone]   [] Home  [] Mobile  [] Work [] Other   [] Okay to leave a voicemail   Medication List:  Current Outpatient Medications:  .  cholecalciferol (VITAMIN D3) 2,000 unit tablet, Take 2,000 Units by mouth Once Daily., Disp: 90 tablet, Rfl: 1 .  clindamycin 1 % swab, Apply 1 Pad topically 2 (two) times a day. (Cleanse affected skin topically twice per day), Disp: 180 each, Rfl: 0 .  cyanocobalamin (VITAMIN B12) 1,000 mcg tablet, Take 1,000 mcg by mouth Once Daily., Disp: 90 tablet, Rfl: 1 .  multivitamin-iron-folic acid  (CENTRUM) 18-400 mg-mcg tab tablet, Take 1 tablet by mouth daily., Disp: , Rfl:  .  tamsulosin  (FLOMAX ) 0.4 mg cap, Take 1 capsule by mouth daily as needed for stone symptoms. Advised to contact urology provider / request office visit if stone symptoms fail to resolve. Go to ER if symptoms become severe., Disp: 30 capsule, Rfl: 1     Medication Request/Refills: Pharmacy Information (if applicable)   [] Not Applicable       []  Pharmacy listed  Send Medication Request to:                                                 [] Pharmacy not listed (added to pharmacy list in Epic) Send Medication Request to:      Listed Pharmacies: [id number] - [place], [place] - [address] [place] - PHONE: [phone] - FAX: [phone]

## 2024-01-23 NOTE — ED Notes (Signed)
 Report received from Marolyn, [place]. Assuming pt care at this time.

## 2024-01-23 NOTE — ED Triage Notes (Signed)
 RLQ abd pain/cramping for 1 week. Dizziness when standing.  Denies vaginal bleeding, NVD  Approx 4 wks preg

## 2024-01-23 NOTE — Discharge Instructions (Signed)
 Please call your GYN office today.  You need repeat blood work in the next 48 hours.  If they are unable to accomplish this please return to the emergency department for repeat blood work.  If you develop any new or suddenly worsening abdominal pain please return to the emergency department immediately for reevaluation.

## 2024-01-25 ENCOUNTER — Ambulatory Visit

## 2024-01-25 ENCOUNTER — Ambulatory Visit: Payer: Self-pay | Admitting: Family Medicine

## 2024-01-25 VITALS — BP 104/68 | HR 107 | Ht 63.0 in | Wt 157.0 lb

## 2024-01-25 DIAGNOSIS — O3680X0 Pregnancy with inconclusive fetal viability, not applicable or unspecified: Secondary | ICD-10-CM

## 2024-01-25 LAB — BETA HCG QUANT (REF LAB): hCG Quant: 4617 m[IU]/mL

## 2024-01-25 NOTE — Progress Notes (Signed)
 Patient here for stat hcg.  Patient states she is having mild cramping as if she is getting menses, more on the right side.  Denies bleeding.  Not taking anything for discomfort.  Advised patient if the pain worsens or starts to bleed to go to [facility] for evaluation.  Will follow up with patient pending results.  Erminio DELENA Rumps, RN

## 2024-01-25 NOTE — Telephone Encounter (Signed)
-----   Message from Jacob J Stinson sent at 01/25/2024  4:30 PM EST ----- Appropriate rise in HCG. US  2 days ago showed intrauterine gestational sac, all of which is reassuring. ----- Message ----- From: Interface, [facility] Lab Results In Sent: 01/25/2024   1:11 PM EST To: Jacob J Stinson, DO

## 2024-01-25 NOTE — Telephone Encounter (Signed)
 Patient aware.  Erminio DELENA Rumps, RN

## 2024-01-26 ENCOUNTER — Other Ambulatory Visit: Payer: Self-pay

## 2024-01-26 DIAGNOSIS — O3680X0 Pregnancy with inconclusive fetal viability, not applicable or unspecified: Secondary | ICD-10-CM

## 2024-02-01 ENCOUNTER — Ambulatory Visit (HOSPITAL_BASED_OUTPATIENT_CLINIC_OR_DEPARTMENT_OTHER)
Admission: RE | Admit: 2024-02-01 | Discharge: 2024-02-01 | Disposition: A | Discharge status: Home / Self Care | Source: Ambulatory Visit | Attending: Family Medicine | Admitting: Family Medicine

## 2024-02-01 ENCOUNTER — Telehealth: Payer: Self-pay

## 2024-02-01 ENCOUNTER — Encounter: Payer: Self-pay | Admitting: Obstetrics and Gynecology

## 2024-02-01 ENCOUNTER — Encounter (HOSPITAL_COMMUNITY): Payer: Self-pay | Admitting: Obstetrics & Gynecology

## 2024-02-01 ENCOUNTER — Inpatient Hospital Stay (HOSPITAL_COMMUNITY)
Admission: AD | Admit: 2024-02-01 | Discharge: 2024-02-01 | Disposition: A | Discharge status: Home / Self Care | Attending: Obstetrics & Gynecology | Admitting: Obstetrics & Gynecology

## 2024-02-01 ENCOUNTER — Other Ambulatory Visit: Payer: Self-pay

## 2024-02-01 DIAGNOSIS — O209 Hemorrhage in early pregnancy, unspecified: Secondary | ICD-10-CM | POA: Insufficient documentation

## 2024-02-01 DIAGNOSIS — J00 Acute nasopharyngitis [common cold]: Secondary | ICD-10-CM | POA: Insufficient documentation

## 2024-02-01 DIAGNOSIS — O3680X0 Pregnancy with inconclusive fetal viability, not applicable or unspecified: Secondary | ICD-10-CM | POA: Diagnosis present

## 2024-02-01 DIAGNOSIS — R1030 Lower abdominal pain, unspecified: Secondary | ICD-10-CM | POA: Insufficient documentation

## 2024-02-01 DIAGNOSIS — O26891 Other specified pregnancy related conditions, first trimester: Secondary | ICD-10-CM | POA: Diagnosis not present

## 2024-02-01 DIAGNOSIS — O26851 Spotting complicating pregnancy, first trimester: Secondary | ICD-10-CM

## 2024-02-01 DIAGNOSIS — Z8616 Personal history of COVID-19: Secondary | ICD-10-CM | POA: Insufficient documentation

## 2024-02-01 DIAGNOSIS — Z3490 Encounter for supervision of normal pregnancy, unspecified, unspecified trimester: Secondary | ICD-10-CM

## 2024-02-01 DIAGNOSIS — Z3A01 Less than 8 weeks gestation of pregnancy: Secondary | ICD-10-CM | POA: Diagnosis not present

## 2024-02-01 DIAGNOSIS — O99511 Diseases of the respiratory system complicating pregnancy, first trimester: Secondary | ICD-10-CM | POA: Insufficient documentation

## 2024-02-01 DIAGNOSIS — R109 Unspecified abdominal pain: Secondary | ICD-10-CM | POA: Diagnosis not present

## 2024-02-01 LAB — URINALYSIS, MICROSCOPIC (REFLEX)

## 2024-02-01 LAB — WET PREP, GENITAL
Clue Cells Wet Prep HPF POC: NONE SEEN
Sperm: NONE SEEN
Trich, Wet Prep: NONE SEEN
WBC, Wet Prep HPF POC: <10 (ref <10)
Yeast Wet Prep HPF POC: NONE SEEN

## 2024-02-01 LAB — URINALYSIS, ROUTINE W REFLEX MICROSCOPIC
Bilirubin Urine: NEGATIVE
Glucose, UA: NEGATIVE mg/dL
Ketones, ur: 15 mg/dL — AB
Leukocytes,Ua: NEGATIVE
Nitrite: NEGATIVE
Protein, ur: NEGATIVE mg/dL
Specific Gravity, Urine: 1.02 (ref 1.005–1.030)
pH: 6 (ref 5.0–8.0)

## 2024-02-01 LAB — HCG, QUANTITATIVE, PREGNANCY: hCG, Beta Chain, Quant, S: 46532 m[IU]/mL — ABNORMAL HIGH (ref <5)

## 2024-02-01 MED ORDER — FLUTICASONE FUROATE-VILANTEROL 200-25 MCG/ACT IN AEPB
1.0000 | INHALATION_SPRAY | Freq: Every day | RESPIRATORY_TRACT | Status: DC
  Administered 2024-02-01: 1 via RESPIRATORY_TRACT
  Filled 2024-02-01: qty 28

## 2024-02-01 MED ORDER — ALBUTEROL SULFATE HFA 108 (90 BASE) MCG/ACT IN AERS: 2.0000 | INHALATION_SPRAY | RESPIRATORY_TRACT | 0 refills | Status: AC | PRN

## 2024-02-01 MED ORDER — HYDROCOD POLI-CHLORPHE POLI ER 10-8 MG/5ML PO SUER: 5.0000 mL | Freq: Every evening | ORAL | 0 refills | Status: AC | PRN

## 2024-02-01 MED ORDER — FLUTICASONE PROPIONATE 50 MCG/ACT NA SUSP
2.0000 | Freq: Every day | NASAL | Status: DC
  Administered 2024-02-01: 2 via NASAL
  Filled 2024-02-01: qty 16

## 2024-02-01 MED ORDER — IBUPROFEN 600 MG PO TABS
600.0000 mg | ORAL_TABLET | Freq: Once | ORAL | Status: AC
  Administered 2024-02-01: 600 mg via ORAL
  Filled 2024-02-01: qty 1

## 2024-02-01 MED ORDER — PHENOL 1.4 % MT LIQD
1.0000 | OROMUCOSAL | Status: DC | PRN
  Administered 2024-02-01: 1 via OROMUCOSAL
  Filled 2024-02-01: qty 177

## 2024-02-01 MED ORDER — GUAIFENESIN ER 600 MG PO TB12
1200.0000 mg | ORAL_TABLET | Freq: Once | ORAL | Status: AC
  Administered 2024-02-01: 1200 mg via ORAL
  Filled 2024-02-01: qty 2

## 2024-02-01 NOTE — ED Provider Notes (Signed)
 ------------------------------------------------------------------------------- Attestation signed by Maude Johnetta Galloway, MD at 02/01/24 [id number] I was available to render services if needed but did not directly participate in the care of this patient.   -------------------------------------------------------------------------------   [facility]  ED Provider Note  Bonnie Cross 33 y.o. female DOB: 1990/09/06 MRN: [id number] History   Chief Complaint  Patient presents with   Flu Like Symptoms    Congestion, sore throat, ear pain, dizziness, and shob x 1 week. [redacted] wks pregnant.   33 year old female G5P4 with an EGA of [redacted] weeks presents today for evaluation of nasal congestion, sore throat, earache occasional cough, chest tightness and some mild dizziness and shortness of breath with coughing.  She denies fever at home, headache, frank chest pain, palpitations or diaphoresis, abdominal pain, nausea, vomiting, diarrhea or constipation.   History provided by:  Patient Language interpreter used: No   Flu Like Symptoms      Past Medical History:  Diagnosis Date   Headache     Past Surgical History:  Procedure Laterality Date   Gum surgery     Mouth surgery     Rhinoplasty     Septoplasty  2012   Wisdom tooth extraction      Social History   Substance and Sexual Activity  Alcohol Use No   Alcohol/week: 0.0 standard drinks of alcohol   Tobacco Use History[1] E-Cigarettes   Vaping Use     Start Date     Cartridges/Day     Quit Date     Social History   Substance and Sexual Activity  Drug Use No         Allergies[2]  Discharge Medication List as of 02/01/2024  1:31 AM     CONTINUE these medications which have NOT CHANGED   Details  butalbital-acetaminophen -caffeine (FIORICET WITH CODEINE) 50-325-40-30 mg per capsule Take 1 capsule by mouth every 4 (four) hours as needed for Headaches., Until Discontinued,  Historical Med    diclofenac sodium (VOLTAREN) 1% GEL Apply small amount over tender scalp up to 4 times daily., Normal    gabapentin (NEURONTIN) 100 mg capsule Taper between 1-3 capsules every night as specifically instructed.  Caution: sedation, dizziness, swelling, mood change., Normal    lidocaine -prilocaine (EMLA) cream Apply topically every 4-6 hours as needed for pain., Normal    naproxen sodium (ANAPROX DS) 550 mg tablet Take 1/2-1 tablet every 8 hours as needed for moderate pain.  Take with food.  Stop if any GI discomfort., Normal    tiZANidine (ZANAFLEX) 4 mg tablet Take 1/4-1 every 8 hours for moderate headache and/or neck pain. Caution: sedation, dizziness.  If taken consistently, do not stop abruptly., Normal    vitamin D3, cholecalciferol, (OPTIMAL-D) 50,000 units CAPS Take 50,000 Units by mouth once a week., Until Discontinued, Historical Med        Primary Survey   Exposure     No visible trauma noted on back exam.      Review of Systems   Review of Systems  All other systems reviewed and are negative. Refer to HPI for review of systems. Unless otherwise noted, the patient's 5-point ROS is negative.  Physical Exam   ED Triage Vitals [01/31/24 2012]  BP 120/74  Heart Rate 105  Resp 16  SpO2 99 %  Temp 98.4 F (36.9 C)    Physical Exam  Nursing note and vitals reviewed. Constitutional: She appears well-developed and well-nourished. She does not appear distressed and does not appear ill.  HENT:  Head: Normocephalic and atraumatic. There is no facial tenderness. Right Ear: Tympanic membrane is bulging.  Left Ear: Tympanic membrane is bulging.  Nose: Nose normal.  Mouth/Throat: Posterior oropharyngeal erythema. No oropharyngeal exudate and peritonsillar abscesses.Uvula is midline. No trismus not present in the jaw.  Eyes: EOM are intact. Conjunctivae are normal. Pupils are equal, round, and reactive to light.  Neck: Neck supple.  Cardiovascular:  Normal rate, regular rhythm and normal heart sounds.  Pulmonary/Chest: Respiratory effort normal and breath sounds normal. No chest wall tenderness.  Abdominal: Soft. There is no abdominal tenderness. Bowel sounds are normal.  Musculoskeletal: Normal range of motion. No visible trauma noted on back exam.     Cervical back: Neck supple.   Neurological: She is alert and oriented to person, place, and time.  Skin: Skin is warm. Skin is dry.  Psychiatric: She has a normal mood and affect. Her behavior is normal. Judgment and thought content normal.     ED Course   Lab results:   BETA STREP SCREEN THROAT - Normal      Result Value   Strep A Negative    POCT SARS COV-2 FLU A/B ANTIGEN - Normal   Covid POC Negative     Flu A POC Negative     Flu B POC Negative     Lot Number [id number]     Expiration Date 2024-04-19    CULTURE, THROAT    Imaging: No data to display   ECG: ECG Results   None                                                                        Pre-Sedation Procedures    Medical Decision Making Differential diagnoses considered today include but are not limited to the following: COVID 19, influenza, viral upper respiratory infection, respiratory distress, sinusitis, nasal discharge, tracheobronchitis, viral or bacterial pneumonia, pertussis, viral or strep pharyngitis or post nasal drip.  Patient nontoxic and healthy appearing.  Vital signs stable and within expected ranges.  She was alert and oriented throughout her ED course.  Examination is as indicated previously.  ED workup included testing for COVID/influenza/RSV and strep throat which were all negative.  Plan: Patient will discharge home with plans for follow-up evaluation with her obstetrician.  I provided prescriptions for her for medication safe to take during pregnancy to include Zyrtec and Flonase  for her to pick up at her pharmacy.  She was counseled that she  can use Tylenol  for fever or bodyaches as needed in accordance with the directions on the package.  Return precautions were discussed with the patient understands she should return to the emergency room for worsening symptoms or any urgent or emergent medical concerns.  Risk OTC drugs.          Provider Communication  Discharge Medication List as of 02/01/2024  1:31 AM     START taking these medications   Details  cetirizine (ZYRTEC) 10 mg tablet Take one tablet (10 mg dose) by mouth daily., Starting Wed 02/01/2024, Normal    fluticasone  propionate (FLONASE ) 50 mcg/actuation nasal spray two sprays by Both Nostrils route daily., Starting Wed 02/01/2024, Until Thu 01/31/2025, Normal        Discharge Medication  List as of 02/01/2024  1:31 AM      Discharge Medication List as of 02/01/2024  1:31 AM      Clinical Impression Final diagnoses:  Upper respiratory infection with cough and congestion    ED Disposition     ED Disposition  Discharge   Condition  Stable   Comment  --                 Follow-up Information     Schedule an appointment as soon as possible for a visit  with [facility] for [facility].   Contact information: [address] [place]  [address] [phone]                 Electronically signed by:       [1] Social History Tobacco Use  Smoking Status Never  Smokeless Tobacco Never  [2] Allergies Allergen Reactions   Iron Unknown    She states the thinks it was IV iron that caused her to get sick   Ozell Sherwood Banks, PA-C 02/01/24 0235

## 2024-02-01 NOTE — MAU Provider Note (Signed)
 History     CSN: [id number]  Arrival date and time: 02/01/24 1257   Event Date/Time   First Provider Initiated Contact with Patient 02/01/24 1655      Chief Complaint  Patient presents with   Vaginal Bleeding   Abdominal Pain   HPI Bonnie Cross is a 33 y.o. year old G35P4004 female at [redacted]w[redacted]d weeks gestation who presents to MAU reporting spotting and lower abdominal cramping/pressure. She describes the pressure as feeling like I've got to push a baby out. She rates this 6/10. She had a transvaginal and transabdominal U/S today at the [facility] U/S department. She plans to get her [facility] with   OB History     Gravida  5   Para  4   Term  4   Preterm      AB      Living  4      SAB      IAB      Ectopic      Multiple  0   Live Births  4           Past Medical History:  Diagnosis Date   COVID-19 affecting pregnancy in first trimester 12/04/2019   Had covid 11/22/19  Mild disease pattern  Advised to get Covid vaccine     Medical history non-contributory     Past Surgical History:  Procedure Laterality Date   NOSE SURGERY      Family History  Problem Relation Age of Onset   Cancer Paternal Uncle        blood CA   Healthy Mother    Stroke Father    Heart disease Father    Diabetes Neg Hx    Hearing loss Neg Hx     Social History   Tobacco Use   Smoking status: Never   Smokeless tobacco: Never  Vaping Use   Vaping status: Never Used  Substance Use Topics   Alcohol use: Never   Drug use: Never    Allergies:  Allergies  Allergen Reactions   Feraheme  [Ferumoxytol ] Shortness Of Breath, Anxiety and Other (See Comments)    Medications Prior to Admission  Medication Sig Dispense Refill Last Dose/Taking   Multiple Vitamin (MULTIVITAMINS PO) Take 1 capsule by mouth daily. (Patient not taking: Reported on 01/25/2024)       Review of Systems  Constitutional: Negative.   HENT:  Positive for congestion and sore throat.   Eyes: Negative.    Respiratory:  Positive for cough and chest tightness.   Cardiovascular: Negative.   Gastrointestinal: Negative.   Endocrine: Negative.   Genitourinary:  Positive for dysuria and pelvic pain (lots of pressure like I've got to push to have a baby).  Musculoskeletal: Negative.   Skin: Negative.   Allergic/Immunologic: Negative.   Neurological: Negative.   Hematological: Negative.   Psychiatric/Behavioral: Negative.     Physical Exam   Blood pressure 104/68, pulse (!) 107, temperature 98.6 F (37 C), temperature source Oral, resp. rate 18, height 5' 3 (1.6 m), weight 71.4 kg, last menstrual period 12/18/2023, SpO2 100%, unknown if currently breastfeeding.  Physical Exam Constitutional:      Appearance: Normal appearance. She is normal weight.  Cardiovascular:     Rate and Rhythm: Normal rate.  Pulmonary:     Effort: Pulmonary effort is normal.  Abdominal:     General: Abdomen is flat.     Palpations: Abdomen is soft.  Genitourinary:    General: Normal vulva.  Comments: Pelvic exam: External genitalia normal, SE: vaginal walls pink and well rugated, cervix is smooth, pink, no lesions, scant amt of red, blood tinged mucous -- WP, GC/CT done, cervix visually closed. Bimanual deferred. Musculoskeletal:        General: Normal range of motion.  Skin:    General: Skin is warm and dry.  Neurological:     Mental Status: She is alert and oriented to person, place, and time.  Psychiatric:        Mood and Affect: Mood normal.        Behavior: Behavior normal.        Thought Content: Thought content normal.        Judgment: Judgment normal.     MAU Course  Procedures  MDM CCUA HCG Wet Prep GC/CT -- Results pending  Flonase  2 puffs each nare Chloraseptic in throat Mucinex  1200 mg  Breo Ellipta  1 puff  Results for orders placed or performed during the hospital encounter of 02/01/24 (from the past 24 hours)  Urinalysis, Routine w reflex microscopic -Urine, Clean Catch      Status: Abnormal   Collection Time: 02/01/24  1:54 PM  Result Value Ref Range   Color, Urine YELLOW YELLOW   APPearance CLEAR CLEAR   Specific Gravity, Urine 1.020 1.005 - 1.030   pH 6.0 5.0 - 8.0   Glucose, UA NEGATIVE NEGATIVE mg/dL   Hgb urine dipstick LARGE (A) NEGATIVE   Bilirubin Urine NEGATIVE NEGATIVE   Ketones, ur 15 (A) NEGATIVE mg/dL   Protein, ur NEGATIVE NEGATIVE mg/dL   Nitrite NEGATIVE NEGATIVE   Leukocytes,Ua NEGATIVE NEGATIVE  Urinalysis, Microscopic (reflex)     Status: Abnormal   Collection Time: 02/01/24  1:54 PM  Result Value Ref Range   RBC / HPF 11-20 0 - 5 RBC/hpf   WBC, UA 0-5 0 - 5 WBC/hpf   Bacteria, UA RARE (A) NONE SEEN   Squamous Epithelial / HPF 0-5 0 - 5 /HPF   Mucus PRESENT   Wet prep, genital     Status: None   Collection Time: 02/01/24  2:01 PM  Result Value Ref Range   Yeast Wet Prep HPF POC NONE SEEN NONE SEEN   Trich, Wet Prep NONE SEEN NONE SEEN   Clue Cells Wet Prep HPF POC NONE SEEN NONE SEEN   WBC, Wet Prep HPF POC <10 <10   Sperm NONE SEEN   hCG, quantitative, pregnancy     Status: Abnormal   Collection Time: 02/01/24  2:14 PM  Result Value Ref Range   hCG, Beta Chain, Quant, S 46,532 (H) <5 mIU/mL    Assessment and Plan  1. Acute nasopharyngitis (Primary) - Sent Flonase  given in MAU home with patient - Information provided on Safe Meds in Pregnancy   2. Spotting and cramping affecting pregnancy, antepartum - Return to MAU: If you have heavier bleeding that soaks through more that 2 pads per hour for an hour or more If you bleed so much that you feel like you might pass out or you do pass out If you have significant abdominal pain that is not improved with Tylenol  1000 mg every 8 hours as needed for pain If you develop a fever > 100.5   3. Intrauterine pregnancy - Start PNC  4. [redacted] weeks gestation of pregnancy   - Discharge home - Schedule appt with [facility] to start [facility] - Patient verbalized an understanding of the plan of  care and agrees.   Maebel Marasco, CNM  02/01/2024, 4:55 PM

## 2024-02-01 NOTE — Discharge Instructions (Signed)
 Return to MAU: If you have heavier bleeding that soaks through more that 2 pads per hour for an hour or more If you bleed so much that you feel like you might pass out or you do pass out If you have significant abdominal pain that is not improved with Tylenol 1000 mg every 8 hours as needed for pain If you develop a fever > 100.5

## 2024-02-01 NOTE — Telephone Encounter (Signed)
 Patient walked into the office stating she is having some vaginal bleeding and cramping. Advised patient to go to MAU at [address] [facility] entrance C.Understanding was voiced. Hristopher Missildine l Lavaun Greenfield, CMA

## 2024-02-01 NOTE — MAU Note (Signed)
 Bonnie Cross is a 33 y.o. at Unknown here in MAU reporting: she's having spotting and lower abdominal cramping and tightness that began last night.  Reports had a ultrasound (both vaginal & abdominal) today in [facility].  LMP: 12/18/2023 Onset of complaint: yesterday Pain score: 6 Vitals:   02/01/24 1346  BP: 104/68  Pulse: (!) 107  Resp: 18  Temp: 98.6 F (37 C)  SpO2: 100%     FHT: NA  Lab orders placed from triage: UA

## 2024-02-02 LAB — GC/CHLAMYDIA PROBE AMP (~~LOC~~) NOT AT ARMC
Chlamydia: NEGATIVE
Comment: NEGATIVE
Comment: NORMAL
Neisseria Gonorrhea: NEGATIVE

## 2024-02-10 ENCOUNTER — Ambulatory Visit: Payer: Self-pay | Admitting: Family Medicine

## 2024-02-10 DIAGNOSIS — O3680X1 Pregnancy with inconclusive fetal viability, fetus 1: Secondary | ICD-10-CM

## 2024-02-10 NOTE — Telephone Encounter (Signed)
 Patient aware.  Order placed for follow up US .  Erminio DELENA Rumps, RN

## 2024-02-10 NOTE — Telephone Encounter (Signed)
-----   Message from Jacob Stinson, DO sent at 02/10/2024  9:08 AM EST ----- Needs f/u US  for viability - 10-14 days from date US  was done (12/10).

## 2024-02-29 ENCOUNTER — Other Ambulatory Visit: Payer: Self-pay | Admitting: Family Medicine

## 2024-02-29 ENCOUNTER — Other Ambulatory Visit: Payer: Self-pay

## 2024-02-29 ENCOUNTER — Other Ambulatory Visit

## 2024-02-29 DIAGNOSIS — O3680X1 Pregnancy with inconclusive fetal viability, fetus 1: Secondary | ICD-10-CM

## 2024-02-29 DIAGNOSIS — Z3491 Encounter for supervision of normal pregnancy, unspecified, first trimester: Secondary | ICD-10-CM

## 2024-02-29 DIAGNOSIS — Z3A09 9 weeks gestation of pregnancy: Secondary | ICD-10-CM

## 2024-03-27 ENCOUNTER — Other Ambulatory Visit: Payer: Self-pay

## 2024-03-27 ENCOUNTER — Other Ambulatory Visit (HOSPITAL_COMMUNITY): Admission: RE | Admit: 2024-03-27 | Discharge: 2024-03-27 | Disposition: A | Source: Ambulatory Visit

## 2024-03-27 ENCOUNTER — Ambulatory Visit

## 2024-03-27 VITALS — BP 119/72 | HR 115 | Wt 164.0 lb

## 2024-03-27 DIAGNOSIS — Z3482 Encounter for supervision of other normal pregnancy, second trimester: Secondary | ICD-10-CM

## 2024-03-27 DIAGNOSIS — Z3A14 14 weeks gestation of pregnancy: Secondary | ICD-10-CM

## 2024-03-27 DIAGNOSIS — Z3402 Encounter for supervision of normal first pregnancy, second trimester: Secondary | ICD-10-CM | POA: Insufficient documentation

## 2024-03-27 MED ORDER — PREPLUS 27-1 MG PO TABS: 1.0000 | ORAL_TABLET | Freq: Every day | ORAL | 13 refills | Status: AC

## 2024-03-27 NOTE — Progress Notes (Signed)
 New OB Intake  I explained I am completing New OB Intake today. We discussed EDD of 09/23/2024, by Last Menstrual Period. Pt is G5P4004. I reviewed her allergies, medications and Medical/Surgical/OB history.    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  Patient also informed that the ultrasound is not being completed with the intent of assessing for fetal or placental anomalies or any pelvic abnormalities. Explained that the purpose of today's ultrasound is to assess for viability.  Patient acknowledges the purpose of the exam and the limitations of the study.     Delivery Plans Plans to deliver at [facility]. Discussed the nature of our practice with multiple providers including residents and students. Due to the size of the practice, the delivering provider may not be the same as those providing prenatal care.   MyChart/Babyscripts MyChart access verified. I explained pt will have some visits in office and some virtually. Babyscripts app discussed and ordered.   Blood Pressure Cuff Blood pressure cuff discussed.  Discussed to be used for virtual visits and or if needed BP checks weekly.  Anatomy US  Explained first scheduled US  will be around 19 weeks.   Last Pap Diagnosis  Date Value Ref Range Status  10/28/2023   Final   - Negative for intraepithelial  lesion or malignancy (NILM)    First visit review I reviewed new OB appt with patient. Explained pt will be seen by Dr. Cleatus at first visit. Discussed [place] genetic screening with patient. Routine prenatal labs ordered.    Bonnie Cross, [place] 03/27/2024  11:48 AM

## 2024-03-28 ENCOUNTER — Encounter: Payer: Self-pay | Admitting: Obstetrics and Gynecology

## 2024-03-28 DIAGNOSIS — N83201 Unspecified ovarian cyst, right side: Secondary | ICD-10-CM | POA: Insufficient documentation

## 2024-03-28 LAB — CBC/D/PLT+RPR+RH+ABO+RUBIGG...
Antibody Screen: NEGATIVE
Basophils Absolute: 0 10*3/uL (ref 0.0–0.2)
Basos: 0 %
EOS (ABSOLUTE): 0 10*3/uL (ref 0.0–0.4)
Eos: 0 %
HCV Ab: NONREACTIVE
HIV Screen 4th Generation wRfx: NONREACTIVE
Hematocrit: 35.5 % (ref 34.0–46.6)
Hemoglobin: 11.5 g/dL (ref 11.1–15.9)
Hepatitis B Surface Ag: NEGATIVE
Immature Grans (Abs): 0 10*3/uL (ref 0.0–0.1)
Immature Granulocytes: 0 %
Lymphocytes Absolute: 1.9 10*3/uL (ref 0.7–3.1)
Lymphs: 25 %
MCH: 28.2 pg (ref 26.6–33.0)
MCHC: 32.4 g/dL (ref 31.5–35.7)
MCV: 87 fL (ref 79–97)
Monocytes Absolute: 0.5 10*3/uL (ref 0.1–0.9)
Monocytes: 7 %
Neutrophils Absolute: 5.2 10*3/uL (ref 1.4–7.0)
Neutrophils: 68 %
Platelets: 266 10*3/uL (ref 150–450)
RBC: 4.08 x10E6/uL (ref 3.77–5.28)
RDW: 14.3 % (ref 11.7–15.4)
RPR Ser Ql: NONREACTIVE
Rh Factor: POSITIVE
Rubella Antibodies, IGG: 18.3 {index}
WBC: 7.7 10*3/uL (ref 3.4–10.8)

## 2024-03-28 LAB — HEMOGLOBIN A1C
Est. average glucose Bld gHb Est-mCnc: 108 mg/dL
Hgb A1c MFr Bld: 5.4 % (ref 4.8–5.6)

## 2024-03-28 LAB — HCV INTERPRETATION

## 2024-03-29 LAB — CERVICOVAGINAL ANCILLARY ONLY
Chlamydia: NEGATIVE
Comment: NEGATIVE
Comment: NORMAL
Neisseria Gonorrhea: NEGATIVE

## 2024-03-30 LAB — CULTURE, OB URINE

## 2024-03-30 LAB — URINE CULTURE, OB REFLEX

## 2024-04-04 ENCOUNTER — Encounter: Admitting: Obstetrics and Gynecology
